# Patient Record
Sex: Male | Born: 1985 | Race: White | Hispanic: No | Marital: Single | Smoking: Current every day smoker
Health system: Southern US, Community
[De-identification: ages and names within clinical notes are randomized; demographics above are authoritative.]

## PROBLEM LIST (undated history)

## (undated) DIAGNOSIS — F1120 Opioid dependence, uncomplicated: Secondary | ICD-10-CM

## (undated) DIAGNOSIS — Z9889 Other specified postprocedural states: Secondary | ICD-10-CM

## (undated) DIAGNOSIS — J45909 Unspecified asthma, uncomplicated: Secondary | ICD-10-CM

## (undated) HISTORY — PX: CRANIOTOMY: SHX93

## (undated) HISTORY — PX: OTHER SURGICAL HISTORY: SHX169

---

## 2006-03-31 ENCOUNTER — Inpatient Hospital Stay (HOSPITAL_COMMUNITY): Admission: AC | Admit: 2006-03-31 | Discharge: 2006-04-07 | Payer: Self-pay

## 2006-04-06 ENCOUNTER — Ambulatory Visit: Payer: Self-pay | Admitting: Dentistry

## 2006-04-07 ENCOUNTER — Ambulatory Visit: Payer: Self-pay | Admitting: Physical Medicine & Rehabilitation

## 2006-04-07 ENCOUNTER — Inpatient Hospital Stay (HOSPITAL_COMMUNITY)
Admission: RE | Admit: 2006-04-07 | Discharge: 2006-04-15 | Payer: Self-pay | Admitting: Physical Medicine & Rehabilitation

## 2006-04-22 ENCOUNTER — Encounter
Admission: RE | Admit: 2006-04-22 | Discharge: 2006-07-21 | Payer: Self-pay | Admitting: Physical Medicine & Rehabilitation

## 2006-05-03 ENCOUNTER — Inpatient Hospital Stay (HOSPITAL_COMMUNITY): Admission: RE | Admit: 2006-05-03 | Discharge: 2006-05-05 | Payer: Self-pay | Admitting: Neurosurgery

## 2006-05-12 ENCOUNTER — Encounter
Admission: RE | Admit: 2006-05-12 | Discharge: 2006-08-10 | Payer: Self-pay | Admitting: Physical Medicine & Rehabilitation
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
## 2008-08-24 ENCOUNTER — Emergency Department (HOSPITAL_BASED_OUTPATIENT_CLINIC_OR_DEPARTMENT_OTHER): Admission: EM | Admit: 2008-08-24 | Discharge: 2008-08-24 | Payer: Self-pay | Admitting: Emergency Medicine

## 2008-08-24 ENCOUNTER — Ambulatory Visit: Payer: Self-pay | Admitting: Diagnostic Radiology

## 2008-09-07 ENCOUNTER — Emergency Department (HOSPITAL_BASED_OUTPATIENT_CLINIC_OR_DEPARTMENT_OTHER): Admission: EM | Admit: 2008-09-07 | Discharge: 2008-09-07 | Payer: Self-pay | Admitting: Emergency Medicine

## 2008-09-08 ENCOUNTER — Ambulatory Visit: Payer: Self-pay | Admitting: Radiology

## 2008-09-08 ENCOUNTER — Emergency Department (HOSPITAL_BASED_OUTPATIENT_CLINIC_OR_DEPARTMENT_OTHER): Admission: EM | Admit: 2008-09-08 | Discharge: 2008-09-08 | Payer: Self-pay | Admitting: Emergency Medicine

## 2008-12-08 ENCOUNTER — Emergency Department (HOSPITAL_BASED_OUTPATIENT_CLINIC_OR_DEPARTMENT_OTHER): Admission: EM | Admit: 2008-12-08 | Discharge: 2008-12-08 | Payer: Self-pay | Admitting: Emergency Medicine

## 2009-01-23 ENCOUNTER — Emergency Department (HOSPITAL_BASED_OUTPATIENT_CLINIC_OR_DEPARTMENT_OTHER): Admission: EM | Admit: 2009-01-23 | Discharge: 2009-01-23 | Payer: Self-pay | Admitting: Emergency Medicine

## 2009-01-23 ENCOUNTER — Ambulatory Visit: Payer: Self-pay | Admitting: Diagnostic Radiology

## 2009-02-23 ENCOUNTER — Ambulatory Visit: Payer: Self-pay | Admitting: Diagnostic Radiology

## 2009-02-23 ENCOUNTER — Emergency Department (HOSPITAL_BASED_OUTPATIENT_CLINIC_OR_DEPARTMENT_OTHER): Admission: EM | Admit: 2009-02-23 | Discharge: 2009-02-23 | Payer: Self-pay | Admitting: Emergency Medicine

## 2009-07-28 ENCOUNTER — Emergency Department (HOSPITAL_BASED_OUTPATIENT_CLINIC_OR_DEPARTMENT_OTHER): Admission: EM | Admit: 2009-07-28 | Discharge: 2009-07-28 | Payer: Self-pay | Admitting: Emergency Medicine
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
## 2014-09-09 ENCOUNTER — Encounter (HOSPITAL_BASED_OUTPATIENT_CLINIC_OR_DEPARTMENT_OTHER): Payer: Self-pay | Admitting: *Deleted

## 2014-09-09 ENCOUNTER — Emergency Department (HOSPITAL_BASED_OUTPATIENT_CLINIC_OR_DEPARTMENT_OTHER): Payer: Medicaid Other

## 2014-09-09 ENCOUNTER — Emergency Department (HOSPITAL_BASED_OUTPATIENT_CLINIC_OR_DEPARTMENT_OTHER)
Admission: EM | Admit: 2014-09-09 | Discharge: 2014-09-09 | Disposition: A | Discharge status: Home / Self Care | Payer: Medicaid Other | Attending: Emergency Medicine | Admitting: Emergency Medicine

## 2014-09-09 DIAGNOSIS — Y998 Other external cause status: Secondary | ICD-10-CM | POA: Insufficient documentation

## 2014-09-09 DIAGNOSIS — Z041 Encounter for examination and observation following transport accident: Secondary | ICD-10-CM | POA: Diagnosis present

## 2014-09-09 DIAGNOSIS — S3992XA Unspecified injury of lower back, initial encounter: Secondary | ICD-10-CM | POA: Diagnosis not present

## 2014-09-09 DIAGNOSIS — Z9889 Other specified postprocedural states: Secondary | ICD-10-CM

## 2014-09-09 DIAGNOSIS — Y92410 Unspecified street and highway as the place of occurrence of the external cause: Secondary | ICD-10-CM | POA: Insufficient documentation

## 2014-09-09 DIAGNOSIS — Y9389 Activity, other specified: Secondary | ICD-10-CM | POA: Insufficient documentation

## 2014-09-09 DIAGNOSIS — S0990XA Unspecified injury of head, initial encounter: Secondary | ICD-10-CM

## 2014-09-09 DIAGNOSIS — Z87820 Personal history of traumatic brain injury: Secondary | ICD-10-CM | POA: Diagnosis not present

## 2014-09-09 HISTORY — DX: Unspecified asthma, uncomplicated: J45.909
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
## 2014-09-09 NOTE — ED Notes (Signed)
Returned from CT.

## 2014-09-09 NOTE — ED Notes (Addendum)
Pt is on the call light every few seconds wanting his charger for his cellphone, states the "radiation exposure"  from the CT has made him nauseated. Wants to sit in a wheelchair. Asking about his girlfriend is also a patient. States he needs to leave soon and go to the " "pain clinic" Pt made aware that it takes awhile for the CT test to be read.

## 2014-09-09 NOTE — ED Notes (Signed)
MD with pt  
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
## 2014-09-09 NOTE — Discharge Instructions (Signed)

## 2014-09-09 NOTE — ED Notes (Signed)
Transported to CT 

## 2020-10-26 ENCOUNTER — Emergency Department (HOSPITAL_BASED_OUTPATIENT_CLINIC_OR_DEPARTMENT_OTHER): Payer: Medicaid Other

## 2020-10-26 ENCOUNTER — Encounter (HOSPITAL_BASED_OUTPATIENT_CLINIC_OR_DEPARTMENT_OTHER): Payer: Self-pay | Admitting: Emergency Medicine

## 2020-10-26 ENCOUNTER — Emergency Department (HOSPITAL_BASED_OUTPATIENT_CLINIC_OR_DEPARTMENT_OTHER)
Admission: EM | Admit: 2020-10-26 | Discharge: 2020-10-27 | Disposition: A | Discharge status: Home / Self Care | Payer: Medicaid Other | Attending: Emergency Medicine | Admitting: Emergency Medicine

## 2020-10-26 ENCOUNTER — Other Ambulatory Visit: Payer: Self-pay

## 2020-10-26 DIAGNOSIS — W109XXA Fall (on) (from) unspecified stairs and steps, initial encounter: Secondary | ICD-10-CM | POA: Insufficient documentation

## 2020-10-26 DIAGNOSIS — S91111A Laceration without foreign body of right great toe without damage to nail, initial encounter: Secondary | ICD-10-CM

## 2020-10-26 DIAGNOSIS — S92414A Nondisplaced fracture of proximal phalanx of right great toe, initial encounter for closed fracture: Secondary | ICD-10-CM | POA: Insufficient documentation

## 2020-10-26 DIAGNOSIS — J45909 Unspecified asthma, uncomplicated: Secondary | ICD-10-CM | POA: Diagnosis not present

## 2020-10-26 DIAGNOSIS — Y9301 Activity, walking, marching and hiking: Secondary | ICD-10-CM | POA: Insufficient documentation

## 2020-10-26 DIAGNOSIS — Y9289 Other specified places as the place of occurrence of the external cause: Secondary | ICD-10-CM | POA: Diagnosis not present

## 2020-10-26 DIAGNOSIS — S99921A Unspecified injury of right foot, initial encounter: Secondary | ICD-10-CM | POA: Diagnosis present

## 2020-10-26 DIAGNOSIS — F17200 Nicotine dependence, unspecified, uncomplicated: Secondary | ICD-10-CM | POA: Diagnosis not present

## 2020-10-26 NOTE — ED Provider Notes (Signed)
[facility] EMERGENCY DEPARTMENT Provider Note   CSN: [id number] Arrival date & time: 10/26/20  2319     History Chief Complaint  Patient presents with   Extremity Laceration    Timothy Smith is a 35 y.o. male.  Patient is a 35 year old male with history of asthma.  He presents with a right great toe injury.  He states he was walking downstairs when he fell and hyperextended his toe and caused a laceration in the area of the MCP joint.  He reports pain with range of motion.  Pain relieved somewhat with rest.  The history is provided by the patient.      Past Medical History:  Diagnosis Date   Asthma     There are no problems to display for this patient.   Past Surgical History:  Procedure Laterality Date   CRANIOTOMY     OTHER SURGICAL HISTORY     collapsed lung        No family history on file.  Social History   Tobacco Use   Smoking status: Every Day    Pack years: 0.00  Substance Use Topics   Alcohol use: No   Drug use: No    Home Medications Prior to Admission medications   Not on File    Allergies    Patient has no known allergies.  Review of Systems   Review of Systems  All other systems reviewed and are negative.  Physical Exam Updated Vital Signs BP (!) 162/107 (BP Location: Right Arm)   Pulse 92   Temp 98.4 F (36.9 C) (Oral)   Resp 18   Ht 5\' 11"  (1.803 m)   Wt 68 kg   SpO2 99%   BMI 20.92 kg/m   Physical Exam Vitals and nursing note reviewed.  Constitutional:      General: He is not in acute distress.    Appearance: Normal appearance. He is not ill-appearing.  HENT:     Head: Normocephalic and atraumatic.  Pulmonary:     Effort: Pulmonary effort is normal.  Musculoskeletal:     Comments: The right great toe has a jagged laceration through callused tissue to the flexor surface.  He is able to flex and extend the toe, but with some discomfort.  Sensation is intact and capillary refill is brisk.  Skin:     General: Skin is warm and dry.  Neurological:     Mental Status: He is alert.    ED Results / Procedures / Treatments   Labs (all labs ordered are listed, but only abnormal results are displayed) Labs Reviewed - No data to display  EKG None  Radiology No results found.  Procedures Procedures   Medications Ordered in ED Medications - No data to display  ED Course  I have reviewed the triage vital signs and the nursing notes.  Pertinent labs & imaging results that were available during my care of the patient were reviewed by me and considered in my medical decision making (see chart for details).    MDM Rules/Calculators/A&P  X-rays show an irregularity in the distal aspect of the first proximal phalanx which may represent an undisplaced fracture, but is otherwise normal.  Patient has a jagged, superficial laceration to the plantar surface of the toe with no tendon involvement.  I see no reason to stitch this and feel as though it will heal fine with immobilization and local wound care.  Patient will be prescribed antibiotics due to this being on the  bottom of his foot.  He is to follow-up as needed.  Final Clinical Impression(s) / ED Diagnoses Final diagnoses:  None    Rx / DC Orders ED Discharge Orders     None        Geoffery Lyons, MD 10/27/20 [phone]

## 2020-10-26 NOTE — ED Triage Notes (Signed)
Reports he fell down some steps.  Now has a lac to the right great toe.

## 2020-10-27 MED ORDER — CEPHALEXIN 500 MG PO CAPS: 500.0000 mg | ORAL_CAPSULE | Freq: Four times a day (QID) | ORAL | 0 refills | Status: DC

## 2020-10-27 MED ORDER — CEPHALEXIN 250 MG PO CAPS
500.0000 mg | ORAL_CAPSULE | Freq: Once | ORAL | Status: AC
  Administered 2020-10-27: 500 mg via ORAL
  Filled 2020-10-27: qty 2

## 2020-10-27 NOTE — Discharge Instructions (Addendum)
Local wound care with bacitracin and dressing changes once daily.    Keep the wound clean dry and covered and keep your toe immobilized for the next 10 to 14 days.  Begin taking Keflex as prescribed.  Return to the ER if you develop redness extending up the foot or leg, pus draining from the wound, worsening pain, or other new and concerning symptoms.

## 2020-10-29 ENCOUNTER — Encounter (HOSPITAL_BASED_OUTPATIENT_CLINIC_OR_DEPARTMENT_OTHER): Payer: Self-pay

## 2020-10-29 ENCOUNTER — Other Ambulatory Visit: Payer: Self-pay

## 2020-10-29 ENCOUNTER — Emergency Department (HOSPITAL_BASED_OUTPATIENT_CLINIC_OR_DEPARTMENT_OTHER)
Admission: EM | Admit: 2020-10-29 | Discharge: 2020-10-29 | Disposition: A | Discharge status: Home / Self Care | Payer: Medicaid Other | Attending: Emergency Medicine | Admitting: Emergency Medicine

## 2020-10-29 DIAGNOSIS — S99921D Unspecified injury of right foot, subsequent encounter: Secondary | ICD-10-CM | POA: Diagnosis present

## 2020-10-29 DIAGNOSIS — J45909 Unspecified asthma, uncomplicated: Secondary | ICD-10-CM | POA: Insufficient documentation

## 2020-10-29 DIAGNOSIS — F17210 Nicotine dependence, cigarettes, uncomplicated: Secondary | ICD-10-CM | POA: Diagnosis not present

## 2020-10-29 DIAGNOSIS — Y9301 Activity, walking, marching and hiking: Secondary | ICD-10-CM | POA: Insufficient documentation

## 2020-10-29 DIAGNOSIS — W108XXD Fall (on) (from) other stairs and steps, subsequent encounter: Secondary | ICD-10-CM | POA: Insufficient documentation

## 2020-10-29 DIAGNOSIS — S91111D Laceration without foreign body of right great toe without damage to nail, subsequent encounter: Secondary | ICD-10-CM | POA: Diagnosis not present

## 2020-10-29 NOTE — Discharge Instructions (Addendum)
Please continue wearing your post op shoe and using the crutches to allow for immobilization of your toe for the next 10 days.  Keep wound clean and dry. You can take Ibuprofen as needed for pain.  Continue taking the antibiotics as prescribed.   Follow up with your PCP regarding ED visit today. You can also follow up with Podiatry for further evaluation.   Return to the ED for any signs of infection including redness/swelling around the wound or redness extending into your foot/leg, drainage of pus, fevers > 100.4, chills, or any other new/concerning symptoms.

## 2020-10-29 NOTE — ED Provider Notes (Signed)
[facility] EMERGENCY DEPARTMENT Provider Note   CSN: [id number] Arrival date & time: 10/29/20  0912     History Chief Complaint  Patient presents with   Laceration    Timothy Smith is a 35 y.o. male who presents to the ED today after sustaining a laceration to his R great toe 3 days ago. Pt was walking down the stairs when he hyperextended his toe causing a laceration to the plantar aspect along the MTP joint.  He was seen in the ED at that time with an x-ray which showed a possible undisplaced fracture of the proximal phalanx.  It was determined that the laceration was too jagged and irregular for suturing.  He was provided a cam walker, crutches, advised to immobilize the toe for the next 10 to 14 days and allow for secondary healing.  He was prescribed antibiotics prevent infection and was advised to come back to the ED for any new/worsening symptoms.  Patient states he is here today for further evaluation.  He has not taken the dressing off for the last 3 days or looked at the toe however he states he was concerned that there may be "pieces of gravel" in the wound itself.  It was irrigated in the ED 3 days ago however.  Patient has been compliant with his antibiotic.  Denies any pain to the area.  He denies any redness, swelling, fevers, chills.  He states he does not have anyone to follow-up with as a he has not seen his PCP in approximately 1 to 2 years prompting return ED visit today.   The history is provided by the patient and medical records.      Past Medical History:  Diagnosis Date   Asthma     There are no problems to display for this patient.   Past Surgical History:  Procedure Laterality Date   CRANIOTOMY     OTHER SURGICAL HISTORY     collapsed lung        History reviewed. No pertinent family history.  Social History   Tobacco Use   Smoking status: Every Day    Packs/day: 0.50    Years: 15.00    Pack years: 7.50    Types: Cigarettes    Smokeless tobacco: Never  Substance Use Topics   Alcohol use: No   Drug use: No    Home Medications Prior to Admission medications   Medication Sig Start Date End Date Taking? Authorizing Provider  cephALEXin (KEFLEX) 500 MG capsule Take 1 capsule (500 mg total) by mouth 4 (four) times daily. 10/27/20  Yes Geoffery Lyons, MD    Allergies    Patient has no known allergies.  Review of Systems   Review of Systems  Constitutional:  Negative for chills and fever.  Musculoskeletal:  Negative for arthralgias.  Skin:  Positive for wound.  All other systems reviewed and are negative.  Physical Exam Updated Vital Signs BP (!) 158/102 (BP Location: Right Arm)   Pulse 98   Temp 98 F (36.7 C) (Oral)   Resp 20   Ht 5\' 11"  (1.803 m)   Wt 68 kg   SpO2 100%   BMI 20.92 kg/m   Physical Exam Vitals and nursing note reviewed.  Constitutional:      Appearance: He is not ill-appearing.  HENT:     Head: Normocephalic and atraumatic.  Eyes:     Conjunctiva/sclera: Conjunctivae normal.  Cardiovascular:     Rate and Rhythm: Normal rate and regular  rhythm.  Pulmonary:     Effort: Pulmonary effort is normal.     Breath sounds: Normal breath sounds.  Musculoskeletal:     Comments: Dressing in place to R great toe removed. Large irregular/jagged laceration to the plantar aspect of the R great toe along the MTP joint; healing by secondary intention. No erythema, edema, or drainage of pus. ROM limited to toe s/2 mild pain. Cap refill < 2 seconds. 2+ DP pulse.   Skin:    General: Skin is warm and dry.     Coloration: Skin is not jaundiced.  Neurological:     Mental Status: He is alert.    ED Results / Procedures / Treatments   Labs (all labs ordered are listed, but only abnormal results are displayed) Labs Reviewed - No data to display  EKG None  Radiology No results found.  Procedures Procedures   Medications Ordered in ED Medications - No data to display  ED Course  I have  reviewed the triage vital signs and the nursing notes.  Pertinent labs & imaging results that were available during my care of the patient were reviewed by me and considered in my medical decision making (see chart for details).    MDM Rules/Calculators/A&P                          35 year old male who presents to the ED today for reevaluation of laceration to his right great toe plantar aspect that occurred 3 days ago.  Evaluated in the ED at that time, laceration determined to be too jagged/irregular for suturing.  Has been using crutches and postop shoe and been compliant with antibiotics.  He states he is mostly concerned that there could be gravel in the wound itself however he does report that it was irrigated while in the ED 3 days ago.  On my exam he has a Product manager in nature to the plantar aspect of his right great toe, along the MTP joint, healing by secondary intention without any signs of infection at this time.  Recently evaluated x-ray from 3 days ago, no signs of foreign body including gravel in the x-ray itself.  Given wound was irrigated 3 days ago I do not feel it needs to be reirrigated at this time.  We will plan for redressing at this time.  Patient advised to continue antibiotics and immobilization for the next 10 days.  He is advised to follow-up with his PCP.  He states he has not seen his PCP in 1 to 2 years however he should still be a patient.  Advised to return to the ED for any signs of infection.  Patient is in agreement with plan and stable for discharge.   This note was prepared using Dragon voice recognition software and may include unintentional dictation errors due to the inherent limitations of voice recognition software.   Final Clinical Impression(s) / ED Diagnoses Final diagnoses:  Laceration of right great toe without foreign body present or damage to nail, subsequent encounter    Rx / DC Orders ED Discharge Orders     None        Discharge  Instructions      Please continue wearing your post op shoe and using the crutches to allow for immobilization of your toe for the next 10 days.  Keep wound clean and dry. You can take Ibuprofen as needed for pain.  Continue taking the antibiotics as prescribed.  Follow up with your PCP regarding ED visit today. You can also follow up with Podiatry for further evaluation.   Return to the ED for any signs of infection including redness/swelling around the wound or redness extending into your foot/leg, drainage of pus, fevers > 100.4, chills, or any other new/concerning symptoms.        Tanda Rockers, PA-C 10/29/20 1027    Pollyann Savoy, MD 10/29/20 1425

## 2020-10-29 NOTE — ED Triage Notes (Signed)
"  Fell down some steps 3 days ago and cut my right great toe. Was treated here and want to get it rechecked today" per pt

## 2021-02-28 ENCOUNTER — Emergency Department (HOSPITAL_BASED_OUTPATIENT_CLINIC_OR_DEPARTMENT_OTHER)
Admission: EM | Admit: 2021-02-28 | Discharge: 2021-02-28 | Disposition: A | Discharge status: Home / Self Care | Payer: Medicaid Other | Attending: Emergency Medicine | Admitting: Emergency Medicine

## 2021-02-28 ENCOUNTER — Other Ambulatory Visit: Payer: Self-pay

## 2021-02-28 ENCOUNTER — Encounter (HOSPITAL_BASED_OUTPATIENT_CLINIC_OR_DEPARTMENT_OTHER): Payer: Self-pay | Admitting: *Deleted

## 2021-02-28 DIAGNOSIS — J45909 Unspecified asthma, uncomplicated: Secondary | ICD-10-CM | POA: Diagnosis not present

## 2021-02-28 DIAGNOSIS — R220 Localized swelling, mass and lump, head: Secondary | ICD-10-CM | POA: Diagnosis present

## 2021-02-28 DIAGNOSIS — M272 Inflammatory conditions of jaws: Secondary | ICD-10-CM | POA: Insufficient documentation

## 2021-02-28 DIAGNOSIS — F17210 Nicotine dependence, cigarettes, uncomplicated: Secondary | ICD-10-CM | POA: Insufficient documentation

## 2021-02-28 MED ORDER — AMOXICILLIN-POT CLAVULANATE 875-125 MG PO TABS: 1.0000 | ORAL_TABLET | Freq: Two times a day (BID) | ORAL | 0 refills | Status: AC

## 2021-02-28 NOTE — ED Notes (Signed)
Patient requesting no IV sticks for labs, EDP aware.

## 2021-02-28 NOTE — ED Triage Notes (Signed)
Woke with swelling to the right side of his face. He has had a toothache recently.

## 2021-02-28 NOTE — Discharge Instructions (Addendum)
Dear Mr. Timothy Smith,  Today we evaluated you in the emergency department for the swelling of face and pain.  This is likely an odontogenic infection.  We will give you a course of Augmentin to take twice daily for 1 week.  It will be very important that you follow-up with a dentist in the area.  We recommend calling the office of Dr. Maryan Puls.  If your symptoms do not improve, if your pain worsens, or if you begin to develop fever chills or pain in your chest please return to the emergency department immediately.
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
## 2021-08-22 ENCOUNTER — Emergency Department (HOSPITAL_BASED_OUTPATIENT_CLINIC_OR_DEPARTMENT_OTHER)
Admission: EM | Admit: 2021-08-22 | Discharge: 2021-08-22 | Disposition: A | Discharge status: Home / Self Care | Payer: Medicaid Other | Attending: Emergency Medicine | Admitting: Emergency Medicine

## 2021-08-22 ENCOUNTER — Other Ambulatory Visit: Payer: Self-pay

## 2021-08-22 ENCOUNTER — Encounter (HOSPITAL_BASED_OUTPATIENT_CLINIC_OR_DEPARTMENT_OTHER): Payer: Self-pay | Admitting: Emergency Medicine

## 2021-08-22 DIAGNOSIS — L539 Erythematous condition, unspecified: Secondary | ICD-10-CM | POA: Insufficient documentation

## 2021-08-22 DIAGNOSIS — R21 Rash and other nonspecific skin eruption: Secondary | ICD-10-CM | POA: Insufficient documentation

## 2021-08-22 MED ORDER — HYDROCORTISONE 2.5 % EX LOTN: TOPICAL_LOTION | Freq: Two times a day (BID) | CUTANEOUS | 0 refills | Status: DC
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
## 2021-08-22 NOTE — ED Provider Notes (Signed)
?[facility] EMERGENCY DEPARTMENT ?Provider Note ? ? ?CSN: [id number] ?Arrival date & time: 08/22/21  1312 ? ?  ? ?History ? ?Chief Complaint  ?Patient presents with  ? Rash  ? ? ?Timothy Smith is a 36 y.o. male. ? ?HPI ?Patient is a 36 year old male who presents to the emergency department due to a rash.  States that it started about 1 day ago.  States that it is burning and itching in sensation and diffuse across his body.  Reports that he purchased a new pair of shoes and a new short which she is currently wearing just prior to the onset of his symptoms.  States that he did not wash the shirt prior to wearing it.  No shortness of breath, angioedema, nausea, vomiting. ?  ? ?Home Medications ?Prior to Admission medications   ?Medication Sig Start Date End Date Taking? Authorizing Provider  ?hydrocortisone 2.5 % lotion Apply topically 2 (two) times daily. 08/22/21  Yes Rayna Sexton, PA-C  ?cephALEXin (KEFLEX) 500 MG capsule Take 1 capsule (500 mg total) by mouth 4 (four) times daily. 10/27/20   Veryl Speak, MD  ?   ? ?Allergies    ?Patient has no known allergies.   ? ?Review of Systems   ?Review of Systems  ?Respiratory:  Negative for shortness of breath.   ?Cardiovascular:  Negative for chest pain.  ?Gastrointestinal:  Negative for nausea and vomiting.  ?Skin:  Positive for color change and rash.  ? ?Physical Exam ?Updated Vital Signs ?BP (!) 178/98 (BP Location: Left Arm)   Pulse 98   Temp 98.4 ?F (36.9 ?C)   Resp 20   Ht 5\' 9"  (1.753 m)   Wt 68 kg   SpO2 100%   BMI 22.15 kg/m?  ?Physical Exam ?Vitals and nursing note reviewed.  ?Constitutional:   ?   General: He is not in acute distress. ?   Appearance: He is well-developed.  ?HENT:  ?   Head: Normocephalic and atraumatic.  ?   Right Ear: External ear normal.  ?   Left Ear: External ear normal.  ?   Mouth/Throat:  ?   Mouth: Mucous membranes are moist.  ?   Pharynx: Oropharynx is clear. No oropharyngeal exudate or posterior oropharyngeal  erythema.  ?   Comments: No angioedema.  Uvula midline.  Readily handling secretions.  No stridor. ?Eyes:  ?   General: No scleral icterus.    ?   Right eye: No discharge.     ?   Left eye: No discharge.  ?   Conjunctiva/sclera: Conjunctivae normal.  ?Neck:  ?   Trachea: No tracheal deviation.  ?Cardiovascular:  ?   Rate and Rhythm: Normal rate.  ?Pulmonary:  ?   Effort: Pulmonary effort is normal. No respiratory distress.  ?   Breath sounds: No stridor.  ?Abdominal:  ?   General: Abdomen is flat. There is no distension.  ?Musculoskeletal:     ?   General: No swelling or deformity.  ?   Cervical back: Neck supple.  ?Skin: ?   General: Skin is warm and dry.  ?   Findings: Erythema present. No rash.  ?   Comments: Fine papular erythematous rash noted along the anterior chest wall.  No drainage noted from the site.  ?Neurological:  ?   General: No focal deficit present.  ?   Mental Status: He is alert and oriented to person, place, and time.  ?   Cranial Nerves: Cranial nerve deficit: no  gross deficits.  ? ?ED Results / Procedures / Treatments   ?Labs ?(all labs ordered are listed, but only abnormal results are displayed) ?Labs Reviewed - No data to display ? ?EKG ?None ? ?Radiology ?No results found. ? ?Procedures ?Procedures  ? ?Medications Ordered in ED ?Medications - No data to display ? ?ED Course/ Medical Decision Making/ A&P ?  ?                        ?Medical Decision Making ?Risk ?Prescription drug management. ? ?Patient is a 36 year old male who presents to the emergency department due to a rash that started yesterday. ? ?On my initial exam patient has a fine papular erythematous rash along the anterior chest wall.  States that he is wearing a new T-shirt that he did not wash prior wearing.  Symptoms started just after wearing the T-shirt.  Appears consistent with contact dermatitis.  Physical exam otherwise reassuring.  No angioedema.  Readily handling secretions.  Speaking clearly and coherently and in no  respiratory distress at this time.  No systemic symptoms concerning for anaphylaxis. ? ?Patient appears stable for discharge at this time and he is agreeable.  Will discharge on a course of hydrocortisone cream.  Additionally recommended Benadryl at night for itchiness as well as difficulty sleeping.  Discussed return precautions.  His questions were answered and he was amicable at the time of discharge. ?Final Clinical Impression(s) / ED Diagnoses ?Final diagnoses:  ?Rash  ? ?Rx / DC Orders ?ED Discharge Orders   ? ?      Ordered  ?  hydrocortisone 2.5 % lotion  2 times daily       ? 08/22/21 1520  ? ?  ?  ? ?  ? ? ?  ?Rayna Sexton, PA-C ?08/22/21 1525 ? ?  ?Hayden Rasmussen, MD ?08/23/21 [phone] ? ?

## 2021-08-22 NOTE — Discharge Instructions (Signed)
Like we discussed, I am prescribing you a steroid cream called hydrocortisone.  This can also be purchased over-the-counter.  Please apply this to the affected regions 1-2 times per day.  This should help with the itching and rash. ? ?I would also recommend trying Benadryl at night.  This will help with itching as well as difficulty sleeping.  Please follow the instructions on the box. ? ?Please continue to monitor your symptoms closely.  If you develop any new or worsening symptoms please come back to the emergency department. ?

## 2021-08-22 NOTE — ED Notes (Signed)
Pt reports "feeling like bugs are crawling on me". Points to discoloration on pants and states that they are on his clothes. Also c/o burning in his eyes and nostrils. ?

## 2022-03-23 ENCOUNTER — Encounter (HOSPITAL_BASED_OUTPATIENT_CLINIC_OR_DEPARTMENT_OTHER): Payer: Self-pay

## 2022-03-23 ENCOUNTER — Other Ambulatory Visit: Payer: Self-pay

## 2022-03-23 ENCOUNTER — Emergency Department (HOSPITAL_BASED_OUTPATIENT_CLINIC_OR_DEPARTMENT_OTHER)
Admission: EM | Admit: 2022-03-23 | Discharge: 2022-03-23 | Disposition: A | Discharge status: Home / Self Care | Payer: Medicare Other | Attending: Emergency Medicine | Admitting: Emergency Medicine

## 2022-03-23 ENCOUNTER — Emergency Department (HOSPITAL_BASED_OUTPATIENT_CLINIC_OR_DEPARTMENT_OTHER): Payer: Medicare Other

## 2022-03-23 DIAGNOSIS — R109 Unspecified abdominal pain: Secondary | ICD-10-CM | POA: Diagnosis present

## 2022-03-23 DIAGNOSIS — R7309 Other abnormal glucose: Secondary | ICD-10-CM | POA: Insufficient documentation

## 2022-03-23 DIAGNOSIS — K5900 Constipation, unspecified: Secondary | ICD-10-CM | POA: Diagnosis not present

## 2022-03-23 LAB — CBC WITH DIFFERENTIAL/PLATELET
Abs Immature Granulocytes: 0.02 10*3/uL (ref 0.00–0.07)
Basophils Absolute: 0 10*3/uL (ref 0.0–0.1)
Basophils Relative: 1 %
Eosinophils Absolute: 0.2 10*3/uL (ref 0.0–0.5)
Eosinophils Relative: 3 %
HCT: 40.2 % (ref 39.0–52.0)
Hemoglobin: 13.9 g/dL (ref 13.0–17.0)
Immature Granulocytes: 0 %
Lymphocytes Relative: 49 %
Lymphs Abs: 3.7 10*3/uL (ref 0.7–4.0)
MCH: 30.5 pg (ref 26.0–34.0)
MCHC: 34.6 g/dL (ref 30.0–36.0)
MCV: 88.2 fL (ref 80.0–100.0)
Monocytes Absolute: 0.7 10*3/uL (ref 0.1–1.0)
Monocytes Relative: 9 %
Neutro Abs: 2.9 10*3/uL (ref 1.7–7.7)
Neutrophils Relative %: 38 %
Platelets: 267 10*3/uL (ref 150–400)
RBC: 4.56 MIL/uL (ref 4.22–5.81)
RDW: 12.1 % (ref 11.5–15.5)
WBC: 7.5 10*3/uL (ref 4.0–10.5)
nRBC: 0 % (ref 0.0–0.2)

## 2022-03-23 LAB — COMPREHENSIVE METABOLIC PANEL
ALT: 43 U/L (ref 0–44)
AST: 29 U/L (ref 15–41)
Albumin: 3.9 g/dL (ref 3.5–5.0)
Alkaline Phosphatase: 82 U/L (ref 38–126)
Anion gap: 4 — ABNORMAL LOW (ref 5–15)
BUN: 22 mg/dL — ABNORMAL HIGH (ref 6–20)
CO2: 28 mmol/L (ref 22–32)
Calcium: 9.1 mg/dL (ref 8.9–10.3)
Chloride: 106 mmol/L (ref 98–111)
Creatinine, Ser: 1 mg/dL (ref 0.61–1.24)
GFR, Estimated: >60 mL/min (ref >60)
Glucose, Bld: 170 mg/dL — ABNORMAL HIGH (ref 70–99)
Potassium: 4 mmol/L (ref 3.5–5.1)
Sodium: 138 mmol/L (ref 135–145)
Total Bilirubin: 0.6 mg/dL (ref 0.3–1.2)
Total Protein: 7.4 g/dL (ref 6.5–8.1)

## 2022-03-23 MED ORDER — POLYETHYLENE GLYCOL 3350 17 GM/SCOOP PO POWD
1.0000 | Freq: Once | ORAL | 0 refills | Status: AC
Start: 2022-03-23 — End: 2022-03-23

## 2022-03-23 MED ORDER — KETOROLAC TROMETHAMINE 30 MG/ML IJ SOLN
15.0000 mg | Freq: Once | INTRAMUSCULAR | Status: AC
  Administered 2022-03-23: 15 mg via INTRAVENOUS
  Filled 2022-03-23: qty 1

## 2022-03-23 NOTE — ED Triage Notes (Signed)
Pt reports abd pain and thinks he has a hernia. Has had it for years; this past month pain has come back and feels like he cannot do anything due to intermittent sharp pains. Pt reports problems with BM this past month (unable to pass BM easily). RLQ pain and has had a previous surgical scar in that area. Pt states he has had CT scans and they have been clear. Pt with hx of kidney stones; still has appendix.

## 2022-03-23 NOTE — Discharge Instructions (Addendum)
Miralax one capful twice daily for one week then one capful daily.

## 2022-03-23 NOTE — ED Provider Notes (Addendum)
[facility] EMERGENCY DEPARTMENT Provider Note   CSN: [id number] Arrival date & time: 03/23/22  1017     History  Chief Complaint  Patient presents with   Abdominal Pain    Timothy Smith is a 36 y.o. male.  The history is provided by the patient.  Abdominal Pain Pain location: RMQ. Pain quality: pressure   Pain radiates to:  Does not radiate Pain severity:  Moderate Onset quality:  Gradual Duration: years. Timing:  Intermittent Progression:  Waxing and waning Chronicity:  Chronic Context: not eating and not trauma   Relieved by:  Nothing Worsened by:  Nothing Ineffective treatments:  None tried Associated symptoms: constipation   Associated symptoms: no dysuria, no fever and no vomiting   Risk factors: no alcohol abuse   Patient presents intermittent abdominal pain for years.  He has also been constipated for several weeks.  He is on methadone.      Home Medications Prior to Admission medications   Medication Sig Start Date End Date Taking? Authorizing Provider  cephALEXin (KEFLEX) 500 MG capsule Take 1 capsule (500 mg total) by mouth 4 (four) times daily. 10/27/20   Geoffery Lyons, MD  hydrocortisone 2.5 % lotion Apply topically 2 (two) times daily. 08/22/21   Placido Sou, PA-C      Allergies    Patient has no known allergies.    Review of Systems   Review of Systems  Constitutional:  Negative for fever.  HENT:  Negative for facial swelling.   Eyes:  Negative for redness.  Respiratory:  Negative for wheezing and stridor.   Gastrointestinal:  Positive for abdominal pain and constipation. Negative for vomiting.  Genitourinary:  Negative for dysuria.  All other systems reviewed and are negative.   Physical Exam Updated Vital Signs BP (!) 174/113 (BP Location: Right Arm)   Pulse (!) 107   Temp 98.1 F (36.7 C) (Oral)   Resp 18   Ht 5\' 9"  (1.753 m)   Wt 63.5 kg   SpO2 100%   BMI 20.67 kg/m  Physical Exam Vitals and nursing note  reviewed. Exam conducted with a chaperone present.  Constitutional:      General: He is not in acute distress.    Appearance: Normal appearance. He is well-developed. He is not diaphoretic.  HENT:     Head: Normocephalic and atraumatic.     Nose: Nose normal.  Eyes:     Conjunctiva/sclera: Conjunctivae normal.     Pupils: Pupils are equal, round, and reactive to light.  Cardiovascular:     Rate and Rhythm: Normal rate and regular rhythm.  Pulmonary:     Effort: Pulmonary effort is normal.     Breath sounds: Normal breath sounds. No wheezing or rales.  Abdominal:     General: Bowel sounds are normal.     Palpations: Abdomen is soft.     Tenderness: There is no abdominal tenderness. There is no guarding or rebound.     Hernia: No hernia is present.  Musculoskeletal:        General: Normal range of motion.     Cervical back: Normal range of motion and neck supple.  Skin:    General: Skin is warm and dry.     Capillary Refill: Capillary refill takes less than 2 seconds.  Neurological:     General: No focal deficit present.     Mental Status: He is alert and oriented to person, place, and time.  Psychiatric:  Mood and Affect: Mood normal.        Behavior: Behavior normal.     ED Results / Procedures / Treatments   Labs (all labs ordered are listed, but only abnormal results are displayed) Results for orders placed or performed during the hospital encounter of 03/23/22  CBC with Differential  Result Value Ref Range   WBC 7.5 4.0 - 10.5 K/uL   RBC 4.56 4.22 - 5.81 MIL/uL   Hemoglobin 13.9 13.0 - 17.0 g/dL   HCT 00.9 38.1 - 82.9 %   MCV 88.2 80.0 - 100.0 fL   MCH 30.5 26.0 - 34.0 pg   MCHC 34.6 30.0 - 36.0 g/dL   RDW 93.7 16.9 - 67.8 %   Platelets 267 150 - 400 K/uL   nRBC 0.0 0.0 - 0.2 %   Neutrophils Relative % 38 %   Neutro Abs 2.9 1.7 - 7.7 K/uL   Lymphocytes Relative 49 %   Lymphs Abs 3.7 0.7 - 4.0 K/uL   Monocytes Relative 9 %   Monocytes Absolute 0.7 0.1 -  1.0 K/uL   Eosinophils Relative 3 %   Eosinophils Absolute 0.2 0.0 - 0.5 K/uL   Basophils Relative 1 %   Basophils Absolute 0.0 0.0 - 0.1 K/uL   Immature Granulocytes 0 %   Abs Immature Granulocytes 0.02 0.00 - 0.07 K/uL  Comprehensive metabolic panel  Result Value Ref Range   Sodium 138 135 - 145 mmol/L   Potassium 4.0 3.5 - 5.1 mmol/L   Chloride 106 98 - 111 mmol/L   CO2 28 22 - 32 mmol/L   Glucose, Bld 170 (H) 70 - 99 mg/dL   BUN 22 (H) 6 - 20 mg/dL   Creatinine, Ser 9.38 0.61 - 1.24 mg/dL   Calcium 9.1 8.9 - 10.1 mg/dL   Total Protein 7.4 6.5 - 8.1 g/dL   Albumin 3.9 3.5 - 5.0 g/dL   AST 29 15 - 41 U/L   ALT 43 0 - 44 U/L   Alkaline Phosphatase 82 38 - 126 U/L   Total Bilirubin 0.6 0.3 - 1.2 mg/dL   GFR, Estimated >75 >10 mL/min   Anion gap 4 (L) 5 - 15   CT Renal Stone Study  Result Date: 03/23/2022 CLINICAL DATA:  Abdominal/flank pain with stone suspected EXAM: CT ABDOMEN AND PELVIS WITHOUT CONTRAST TECHNIQUE: Multidetector CT imaging of the abdomen and pelvis was performed following the standard protocol without IV contrast. RADIATION DOSE REDUCTION: This exam was performed according to the departmental dose-optimization program which includes automated exposure control, adjustment of the mA and/or kV according to patient size and/or use of iterative reconstruction technique. COMPARISON:  None Available. FINDINGS: Lower chest:  No contributory findings. Hepatobiliary: No focal liver abnormality.No evidence of biliary obstruction or stone. Pancreas: Unremarkable. Spleen: Unremarkable. Adrenals/Urinary Tract: Negative adrenals. No hydronephrosis or ureteral stone. At least 3 right and single left renal calculi measuring up to 5 mm at the left lower pole. unremarkable bladder. Stomach/Bowel: No obstruction. No appendicitis. Stool distended ascending and transverse colon. Vascular/Lymphatic: No acute vascular abnormality. No mass or adenopathy. Reproductive:No pathologic findings.  Other: No ascites or pneumoperitoneum. Right upper quadrant abdominal wall scarring that is stable. Musculoskeletal: No acute abnormalities. IMPRESSION: 1. Bilateral nephrolithiasis.  No hydronephrosis or ureteral stone. 2. Moderate stool retention in the proximal colon. Electronically Signed   By: Tiburcio Pea M.D.   On: 03/23/2022 06:47    Medications Ordered in ED Medications - No data to display  ED Course/  Medical Decision Making/ A&P                           Medical Decision Making   Patient with years of pain but thinks he has a hernia because he read this somewhere.    Amount and/or Complexity of Data Reviewed External Data Reviewed: radiology and notes.    Details: Previous notes and imaging reviewed  Labs: ordered.    Details: All labs reviewed:  normal white count 7.5, hemoglobin 13.9, normal platelet count. Normal sodium 138, normal potassium 4, normal creatinine, glucose elevated 170  Radiology: ordered and independent interpretation performed.    Details: No obstructions by me on CT  Risk Prescription drug management. Risk Details: Well appearing with normal exam.  Patient is on methadone and I suspect this has led to constipation.  Stable for discharge with close follow up.  Strict return.  Start miralax therapy 2 times daily x 1 week then daily thereafter.     Final Clinical Impression(s) / ED Diagnoses Final diagnoses:  None   Return for intractable cough, coughing up blood, fevers > 100.4 unrelieved by medication, shortness of breath, intractable vomiting, chest pain, shortness of breath, weakness, numbness, changes in speech, facial asymmetry, abdominal pain, passing out, Inability to tolerate liquids or food, cough, altered mental status or any concerns. No signs of systemic illness or infection. The patient is nontoxic-appearing on exam and vital signs are within normal limits.  I have reviewed the triage vital signs and the nursing notes. Pertinent labs &  imaging results that were available during my care of the patient were reviewed by me and considered in my medical decision making (see chart for details). After history, exam, and medical workup I feel the patient has been appropriately medically screened and is safe for discharge home. Pertinent diagnoses were discussed with the patient. Patient was given return precautions.       Laqueshia Cihlar, MD 03/23/22 0700

## 2022-04-07 ENCOUNTER — Emergency Department (HOSPITAL_BASED_OUTPATIENT_CLINIC_OR_DEPARTMENT_OTHER)
Admission: EM | Admit: 2022-04-07 | Discharge: 2022-04-07 | Discharge status: Against Medical Advice | Payer: Medicare Other | Attending: Emergency Medicine | Admitting: Emergency Medicine

## 2022-04-07 ENCOUNTER — Other Ambulatory Visit: Payer: Self-pay

## 2022-04-07 ENCOUNTER — Encounter (HOSPITAL_BASED_OUTPATIENT_CLINIC_OR_DEPARTMENT_OTHER): Payer: Self-pay | Admitting: *Deleted

## 2022-04-07 DIAGNOSIS — K625 Hemorrhage of anus and rectum: Secondary | ICD-10-CM | POA: Diagnosis present

## 2022-04-07 DIAGNOSIS — F419 Anxiety disorder, unspecified: Secondary | ICD-10-CM | POA: Diagnosis not present

## 2022-04-07 DIAGNOSIS — K5900 Constipation, unspecified: Secondary | ICD-10-CM | POA: Insufficient documentation

## 2022-04-07 DIAGNOSIS — Z5321 Procedure and treatment not carried out due to patient leaving prior to being seen by health care provider: Secondary | ICD-10-CM | POA: Insufficient documentation

## 2022-04-07 LAB — COMPREHENSIVE METABOLIC PANEL
ALT: 58 U/L — ABNORMAL HIGH (ref 0–44)
AST: 39 U/L (ref 15–41)
Albumin: 4.5 g/dL (ref 3.5–5.0)
Alkaline Phosphatase: 83 U/L (ref 38–126)
Anion gap: 7 (ref 5–15)
BUN: 29 mg/dL — ABNORMAL HIGH (ref 6–20)
CO2: 27 mmol/L (ref 22–32)
Calcium: 9.5 mg/dL (ref 8.9–10.3)
Chloride: 104 mmol/L (ref 98–111)
Creatinine, Ser: 1.02 mg/dL (ref 0.61–1.24)
GFR, Estimated: >60 mL/min (ref >60)
Glucose, Bld: 134 mg/dL — ABNORMAL HIGH (ref 70–99)
Potassium: 3.9 mmol/L (ref 3.5–5.1)
Sodium: 138 mmol/L (ref 135–145)
Total Bilirubin: 1.1 mg/dL (ref 0.3–1.2)
Total Protein: 8.2 g/dL — ABNORMAL HIGH (ref 6.5–8.1)

## 2022-04-07 LAB — CBC
HCT: 41.5 % (ref 39.0–52.0)
Hemoglobin: 14.2 g/dL (ref 13.0–17.0)
MCH: 30.1 pg (ref 26.0–34.0)
MCHC: 34.2 g/dL (ref 30.0–36.0)
MCV: 88.1 fL (ref 80.0–100.0)
Platelets: 271 10*3/uL (ref 150–400)
RBC: 4.71 MIL/uL (ref 4.22–5.81)
RDW: 12.1 % (ref 11.5–15.5)
WBC: 9.2 10*3/uL (ref 4.0–10.5)
nRBC: 0 % (ref 0.0–0.2)

## 2022-04-07 LAB — LIPASE, BLOOD: Lipase: 25 U/L (ref 11–51)

## 2022-04-07 NOTE — ED Triage Notes (Signed)
Pt is here due to blood on his stool today.  Pt shows me picture of stool with blood streak on it.  Pt is anxious.  Pt is not aware that he has hemorrhoids.  Pt has been constipated.

## 2022-04-07 NOTE — ED Notes (Signed)
Pt is not in lobby, checked for pt outside as well

## 2022-04-07 NOTE — ED Notes (Signed)
Called pt for room, no answer, not visualized in lobby

## 2022-05-18 ENCOUNTER — Encounter (HOSPITAL_COMMUNITY): Payer: Self-pay

## 2022-05-18 ENCOUNTER — Emergency Department (HOSPITAL_BASED_OUTPATIENT_CLINIC_OR_DEPARTMENT_OTHER): Payer: Medicare Other

## 2022-05-18 ENCOUNTER — Encounter (HOSPITAL_BASED_OUTPATIENT_CLINIC_OR_DEPARTMENT_OTHER): Payer: Self-pay | Admitting: Emergency Medicine

## 2022-05-18 ENCOUNTER — Other Ambulatory Visit: Payer: Self-pay

## 2022-05-18 ENCOUNTER — Inpatient Hospital Stay (HOSPITAL_BASED_OUTPATIENT_CLINIC_OR_DEPARTMENT_OTHER)
Admission: EM | Admit: 2022-05-18 | Discharge: 2022-05-21 | DRG: 516 | Disposition: A | Discharge status: Home / Self Care | Payer: Medicare Other | Attending: Internal Medicine | Admitting: Internal Medicine

## 2022-05-18 DIAGNOSIS — N179 Acute kidney failure, unspecified: Secondary | ICD-10-CM | POA: Diagnosis not present

## 2022-05-18 DIAGNOSIS — M868X4 Other osteomyelitis, hand: Secondary | ICD-10-CM | POA: Diagnosis present

## 2022-05-18 DIAGNOSIS — F17200 Nicotine dependence, unspecified, uncomplicated: Secondary | ICD-10-CM | POA: Diagnosis present

## 2022-05-18 DIAGNOSIS — B9562 Methicillin resistant Staphylococcus aureus infection as the cause of diseases classified elsewhere: Secondary | ICD-10-CM | POA: Diagnosis present

## 2022-05-18 DIAGNOSIS — F1120 Opioid dependence, uncomplicated: Secondary | ICD-10-CM | POA: Diagnosis present

## 2022-05-18 DIAGNOSIS — F1991 Other psychoactive substance use, unspecified, in remission: Secondary | ICD-10-CM | POA: Diagnosis present

## 2022-05-18 DIAGNOSIS — J45909 Unspecified asthma, uncomplicated: Secondary | ICD-10-CM | POA: Diagnosis present

## 2022-05-18 DIAGNOSIS — B182 Chronic viral hepatitis C: Secondary | ICD-10-CM | POA: Diagnosis present

## 2022-05-18 DIAGNOSIS — R768 Other specified abnormal immunological findings in serum: Secondary | ICD-10-CM

## 2022-05-18 DIAGNOSIS — L818 Other specified disorders of pigmentation: Secondary | ICD-10-CM | POA: Diagnosis not present

## 2022-05-18 DIAGNOSIS — F17210 Nicotine dependence, cigarettes, uncomplicated: Secondary | ICD-10-CM | POA: Diagnosis present

## 2022-05-18 DIAGNOSIS — M869 Osteomyelitis, unspecified: Principal | ICD-10-CM | POA: Diagnosis present

## 2022-05-18 DIAGNOSIS — Z79899 Other long term (current) drug therapy: Secondary | ICD-10-CM | POA: Diagnosis not present

## 2022-05-18 DIAGNOSIS — M86241 Subacute osteomyelitis, right hand: Secondary | ICD-10-CM

## 2022-05-18 DIAGNOSIS — F1129 Opioid dependence with unspecified opioid-induced disorder: Secondary | ICD-10-CM | POA: Diagnosis not present

## 2022-05-18 DIAGNOSIS — Z22322 Carrier or suspected carrier of Methicillin resistant Staphylococcus aureus: Secondary | ICD-10-CM | POA: Diagnosis not present

## 2022-05-18 DIAGNOSIS — R7303 Prediabetes: Secondary | ICD-10-CM | POA: Diagnosis not present

## 2022-05-18 DIAGNOSIS — F1911 Other psychoactive substance abuse, in remission: Secondary | ICD-10-CM | POA: Diagnosis not present

## 2022-05-18 DIAGNOSIS — M86141 Other acute osteomyelitis, right hand: Secondary | ICD-10-CM | POA: Diagnosis not present

## 2022-05-18 DIAGNOSIS — M79644 Pain in right finger(s): Secondary | ICD-10-CM | POA: Diagnosis present

## 2022-05-18 HISTORY — DX: Opioid dependence, uncomplicated: F11.20

## 2022-05-18 HISTORY — DX: Other specified postprocedural states: Z98.890

## 2022-05-18 LAB — CBC WITH DIFFERENTIAL/PLATELET
Abs Immature Granulocytes: 0.01 10*3/uL (ref 0.00–0.07)
Basophils Absolute: 0 10*3/uL (ref 0.0–0.1)
Basophils Relative: 1 %
Eosinophils Absolute: 0.2 10*3/uL (ref 0.0–0.5)
Eosinophils Relative: 3 %
HCT: 45.2 % (ref 39.0–52.0)
Hemoglobin: 15.7 g/dL (ref 13.0–17.0)
Immature Granulocytes: 0 %
Lymphocytes Relative: 37 %
Lymphs Abs: 3.1 10*3/uL (ref 0.7–4.0)
MCH: 30 pg (ref 26.0–34.0)
MCHC: 34.7 g/dL (ref 30.0–36.0)
MCV: 86.4 fL (ref 80.0–100.0)
Monocytes Absolute: 0.6 10*3/uL (ref 0.1–1.0)
Monocytes Relative: 8 %
Neutro Abs: 4.3 10*3/uL (ref 1.7–7.7)
Neutrophils Relative %: 51 %
Platelets: 261 10*3/uL (ref 150–400)
RBC: 5.23 MIL/uL (ref 4.22–5.81)
RDW: 11.7 % (ref 11.5–15.5)
WBC: 8.3 10*3/uL (ref 4.0–10.5)
nRBC: 0 % (ref 0.0–0.2)

## 2022-05-18 LAB — BASIC METABOLIC PANEL
Anion gap: 7 (ref 5–15)
BUN: 21 mg/dL — ABNORMAL HIGH (ref 6–20)
CO2: 26 mmol/L (ref 22–32)
Calcium: 9 mg/dL (ref 8.9–10.3)
Chloride: 101 mmol/L (ref 98–111)
Creatinine, Ser: 0.92 mg/dL (ref 0.61–1.24)
GFR, Estimated: >60 mL/min (ref >60)
Glucose, Bld: 125 mg/dL — ABNORMAL HIGH (ref 70–99)
Potassium: 3.9 mmol/L (ref 3.5–5.1)
Sodium: 134 mmol/L — ABNORMAL LOW (ref 135–145)

## 2022-05-18 LAB — HEMOGLOBIN A1C
Hgb A1c MFr Bld: 5.7 % — ABNORMAL HIGH (ref 4.8–5.6)
Mean Plasma Glucose: 116.89 mg/dL

## 2022-05-18 LAB — HIV ANTIBODY (ROUTINE TESTING W REFLEX): HIV Screen 4th Generation wRfx: NONREACTIVE

## 2022-05-18 MED ORDER — POLYETHYLENE GLYCOL 3350 17 G PO PACK: 17.0000 g | PACK | Freq: Every day | ORAL | Status: DC | PRN

## 2022-05-18 MED ORDER — POVIDONE-IODINE 10 % EX SWAB
2.0000 | Freq: Once | CUTANEOUS | Status: AC
  Administered 2022-05-19: 2 via TOPICAL

## 2022-05-18 MED ORDER — METHADONE HCL 10 MG/ML PO CONC
100.0000 mg | Freq: Every day | ORAL | Status: DC
  Administered 2022-05-18 – 2022-05-21 (×4): 100 mg via ORAL
  Filled 2022-05-18 (×3): qty 10

## 2022-05-18 MED ORDER — HYDRALAZINE HCL 20 MG/ML IJ SOLN: 5.0000 mg | INTRAMUSCULAR | Status: DC | PRN

## 2022-05-18 MED ORDER — CHLORHEXIDINE GLUCONATE 4 % EX LIQD
60.0000 mL | Freq: Once | CUTANEOUS | Status: AC
  Administered 2022-05-19: 4 via TOPICAL
  Filled 2022-05-18: qty 60

## 2022-05-18 MED ORDER — MORPHINE SULFATE (PF) 2 MG/ML IV SOLN
2.0000 mg | INTRAVENOUS | Status: DC | PRN
  Administered 2022-05-18 – 2022-05-19 (×10): 2 mg via INTRAVENOUS
  Filled 2022-05-18 (×11): qty 1

## 2022-05-18 MED ORDER — VANCOMYCIN HCL IN DEXTROSE 1-5 GM/200ML-% IV SOLN
1000.0000 mg | Freq: Once | INTRAVENOUS | Status: AC
Start: 2022-05-18 — End: 2022-05-18
  Administered 2022-05-18: 1000 mg via INTRAVENOUS
  Filled 2022-05-18: qty 200

## 2022-05-18 MED ORDER — ACETAMINOPHEN 325 MG PO TABS
650.0000 mg | ORAL_TABLET | Freq: Four times a day (QID) | ORAL | Status: DC | PRN
  Administered 2022-05-18 – 2022-05-21 (×4): 650 mg via ORAL
  Filled 2022-05-18 (×4): qty 2

## 2022-05-18 MED ORDER — METHADONE HCL 10 MG PO TABS: 100.0000 mg | ORAL_TABLET | Freq: Two times a day (BID) | ORAL | Status: DC

## 2022-05-18 MED ORDER — ONDANSETRON HCL 4 MG PO TABS
4.0000 mg | ORAL_TABLET | Freq: Four times a day (QID) | ORAL | Status: DC | PRN
  Filled 2022-05-18: qty 1

## 2022-05-18 MED ORDER — ACETAMINOPHEN 650 MG RE SUPP
650.0000 mg | Freq: Four times a day (QID) | RECTAL | Status: DC | PRN
  Filled 2022-05-18: qty 1

## 2022-05-18 MED ORDER — OXYCODONE HCL 5 MG PO TABS
5.0000 mg | ORAL_TABLET | ORAL | Status: DC | PRN
  Administered 2022-05-19 – 2022-05-21 (×6): 5 mg via ORAL
  Filled 2022-05-18 (×6): qty 1

## 2022-05-18 MED ORDER — NICOTINE 14 MG/24HR TD PT24
14.0000 mg | MEDICATED_PATCH | Freq: Every day | TRANSDERMAL | Status: DC
  Administered 2022-05-18: 14 mg via TRANSDERMAL
  Filled 2022-05-18 (×2): qty 1

## 2022-05-18 MED ORDER — ENOXAPARIN SODIUM 40 MG/0.4ML IJ SOSY
40.0000 mg | PREFILLED_SYRINGE | INTRAMUSCULAR | Status: DC
  Administered 2022-05-18 – 2022-05-20 (×2): 40 mg via SUBCUTANEOUS
  Filled 2022-05-18 (×3): qty 0.4

## 2022-05-18 MED ORDER — ONDANSETRON HCL 4 MG/2ML IJ SOLN: 4.0000 mg | Freq: Four times a day (QID) | INTRAMUSCULAR | Status: DC | PRN

## 2022-05-18 MED ORDER — BISACODYL 5 MG PO TBEC: 5.0000 mg | DELAYED_RELEASE_TABLET | Freq: Every day | ORAL | Status: DC | PRN

## 2022-05-18 MED ORDER — LACTATED RINGERS IV SOLN: INTRAVENOUS | Status: DC
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
## 2022-05-18 NOTE — ED Notes (Signed)
Notified security that the patient will be leaving his car here at [facility] for a few days.

## 2022-05-18 NOTE — ED Provider Notes (Signed)
EMERGENCY DEPARTMENT AT [facility] Provider Note   CSN: [id number] Arrival date & time: 05/18/22  0325     History  Chief Complaint  Patient presents with   Finger Injury    Timothy Smith is a 37 y.o. male.  Patient is a 37 year old male with past medical history of opiate addiction on methadone.  Patient presenting with complaints of right thumb pain and swelling.  This has been worsening over the past 2 weeks.  He does not recall injuring it specifically, believes he might of injured it while working on his car.  Pain has been significantly worsening over the past few days.  He denies fevers or chills.  The history is provided by the patient.       Home Medications Prior to Admission medications   Medication Sig Start Date End Date Taking? Authorizing Provider  cephALEXin (KEFLEX) 500 MG capsule Take 1 capsule (500 mg total) by mouth 4 (four) times daily. 10/27/20   Veryl Speak, MD  hydrocortisone 2.5 % lotion Apply topically 2 (two) times daily. 08/22/21   Rayna Sexton, PA-C      Allergies    Patient has no allergy information on record.    Review of Systems   Review of Systems  All other systems reviewed and are negative.   Physical Exam Updated Vital Signs BP (!) 136/95 (BP Location: Right Arm)   Pulse 98   Temp 98.7 F (37.1 C) (Oral)   Resp 18   Ht 5\' 11"  (1.803 m)   Wt 63.5 kg   SpO2 100%   BMI 19.53 kg/m  Physical Exam Vitals and nursing note reviewed.  Constitutional:      General: He is not in acute distress.    Appearance: Normal appearance. He is not ill-appearing.  HENT:     Head: Normocephalic and atraumatic.  Pulmonary:     Effort: Pulmonary effort is normal.  Musculoskeletal:     Comments: The right thumb is noted to have swelling, redness, warmth, and is extremely tender to the touch.  Skin:    General: Skin is warm and dry.  Neurological:     Mental Status: He is alert.     ED Results / Procedures /  Treatments   Labs (all labs ordered are listed, but only abnormal results are displayed) Labs Reviewed  BASIC METABOLIC PANEL  CBC WITH DIFFERENTIAL/PLATELET    EKG None  Radiology DG Finger Thumb Right  Result Date: 05/18/2022 CLINICAL DATA:  37 year old male with thumb injury 2 weeks ago. Redness and swelling now. EXAM: RIGHT THUMB 2+V COMPARISON:  None Available. FINDINGS: Soft tissue swelling at the right thumb. No soft tissue gas. No radiopaque foreign body identified. But there is aggressive osteolysis involving up to 5 mm of the tuft of the thumb distal phalanx consistent with osteomyelitis. The thumb IP joint is spared. No superimposed fracture or dislocation. IMPRESSION: Positive for OSTEOMYELITIS of the tuft of the thumb distal phalanx. Regional soft tissue swelling. No radiopaque foreign body. Electronically Signed   By: Genevie Ann M.D.   On: 05/18/2022 04:16      Procedures Procedures    Medications Ordered in ED Medications - No data to display  ED Course/ Medical Decision Making/ A&P  Patient presenting with right thumb pain as described in the HPI.  This began in the absence of any specific injury or trauma other than working on his car.  X-rays identify what appears to be osteomyelitis of the distal  phalanx.  Patient to be given IV antibiotics here in the ER and admitted for IV antibiotics and hand consultation.  Final Clinical Impression(s) / ED Diagnoses Final diagnoses:  None    Rx / DC Orders ED Discharge Orders     None         Veryl Speak, MD 05/27/22 [phone]

## 2022-05-18 NOTE — ED Notes (Signed)
Called [facility] for transport talked to [facility] at 2:23

## 2022-05-18 NOTE — Progress Notes (Signed)
Hospitalist Transfer Note:  Transferring facility: [facility] Requesting provider: Dr. Stark Jock (EDP at [facility]) Reason for transfer: admission for further evaluation and management of concern for osteomyelitis of the right thumb.     37 y.o. male, with history of opioid abuse, currently on methadone,  who presented to [facility] ED complaining of several days of right thumb pain, swelling, erythema, after potentially incurring an abrasion of the right thumb while working on his car 2 to 3 weeks ago.  Not associate with any acute sensory deficits and not associated with any report of diminished range of motion.  Erythema/swelling did not extend to the level of the wrist, and there is no report of any diminished wrist flexion/extension associated with the above.  Plain films of the right hand were suggestive of potential osteomyelitis involving the distal phalanx of the thumb.   Vital signs in the ED were notable for the following: Afebrile; normotensive.  Medications administered prior to transfer included the following: Started on IV vancomycin.  Of note, case has not been d/w hand surgeon at this time.    Subsequently, I accepted this patient for transfer for inpatient admission to a MedSurg bed at [facility] or [facility] (first available) for further work-up and management of the above.       Check [email or web address] for on-call coverage.   Nursing staff, Please call [facility] number on Amion as soon as patient's arrival, so appropriate admitting provider can evaluate the pt.     Babs Bertin, DO Hospitalist

## 2022-05-18 NOTE — Progress Notes (Signed)
Patient arrived assessment is complete at this time. BP is elevated due to pain in the right thumb will be going for a wash out tomorrow.

## 2022-05-18 NOTE — ED Notes (Signed)
Pt gave verbal consent for Transfer to [facility]. Pharmacy tech is evaluating his medication history at bedside.

## 2022-05-18 NOTE — ED Triage Notes (Signed)
Pt reports his he "think I injured my right thumb two weeks ago."  Thumb appears swollen and discolored and is very warm to the touch.  Pt has trouble bending thumb and the redness is "going down my thumb."

## 2022-05-19 ENCOUNTER — Inpatient Hospital Stay (HOSPITAL_COMMUNITY): Payer: Medicare Other | Admitting: Certified Registered Nurse Anesthetist

## 2022-05-19 ENCOUNTER — Other Ambulatory Visit: Payer: Self-pay

## 2022-05-19 ENCOUNTER — Encounter (HOSPITAL_COMMUNITY)
Admission: EM | Disposition: A | Discharge status: Home / Self Care | Payer: Self-pay | Source: Home / Self Care | Attending: Internal Medicine

## 2022-05-19 ENCOUNTER — Encounter (HOSPITAL_COMMUNITY): Payer: Self-pay | Admitting: Internal Medicine

## 2022-05-19 DIAGNOSIS — M869 Osteomyelitis, unspecified: Secondary | ICD-10-CM | POA: Diagnosis not present

## 2022-05-19 DIAGNOSIS — F1129 Opioid dependence with unspecified opioid-induced disorder: Secondary | ICD-10-CM | POA: Diagnosis not present

## 2022-05-19 DIAGNOSIS — R7303 Prediabetes: Secondary | ICD-10-CM | POA: Diagnosis not present

## 2022-05-19 DIAGNOSIS — J45909 Unspecified asthma, uncomplicated: Secondary | ICD-10-CM

## 2022-05-19 DIAGNOSIS — F17200 Nicotine dependence, unspecified, uncomplicated: Secondary | ICD-10-CM

## 2022-05-19 DIAGNOSIS — L818 Other specified disorders of pigmentation: Secondary | ICD-10-CM

## 2022-05-19 DIAGNOSIS — F1911 Other psychoactive substance abuse, in remission: Secondary | ICD-10-CM

## 2022-05-19 DIAGNOSIS — M86141 Other acute osteomyelitis, right hand: Secondary | ICD-10-CM | POA: Diagnosis not present

## 2022-05-19 DIAGNOSIS — F17210 Nicotine dependence, cigarettes, uncomplicated: Secondary | ICD-10-CM | POA: Diagnosis not present

## 2022-05-19 DIAGNOSIS — F1120 Opioid dependence, uncomplicated: Secondary | ICD-10-CM

## 2022-05-19 HISTORY — PX: I & D EXTREMITY: SHX5045

## 2022-05-19 LAB — BASIC METABOLIC PANEL
Anion gap: 6 (ref 5–15)
BUN: 13 mg/dL (ref 6–20)
CO2: 28 mmol/L (ref 22–32)
Calcium: 9.1 mg/dL (ref 8.9–10.3)
Chloride: 104 mmol/L (ref 98–111)
Creatinine, Ser: 0.85 mg/dL (ref 0.61–1.24)
GFR, Estimated: >60 mL/min (ref >60)
Glucose, Bld: 104 mg/dL — ABNORMAL HIGH (ref 70–99)
Potassium: 4.2 mmol/L (ref 3.5–5.1)
Sodium: 138 mmol/L (ref 135–145)

## 2022-05-19 LAB — SEDIMENTATION RATE: Sed Rate: 17 mm/hr — ABNORMAL HIGH (ref 0–16)

## 2022-05-19 LAB — CBC
HCT: 44.2 % (ref 39.0–52.0)
Hemoglobin: 15.1 g/dL (ref 13.0–17.0)
MCH: 30.4 pg (ref 26.0–34.0)
MCHC: 34.2 g/dL (ref 30.0–36.0)
MCV: 88.9 fL (ref 80.0–100.0)
Platelets: 248 10*3/uL (ref 150–400)
RBC: 4.97 MIL/uL (ref 4.22–5.81)
RDW: 11.8 % (ref 11.5–15.5)
WBC: 7.2 10*3/uL (ref 4.0–10.5)
nRBC: 0 % (ref 0.0–0.2)

## 2022-05-19 LAB — SURGICAL PCR SCREEN
MRSA, PCR: POSITIVE — AB
Staphylococcus aureus: POSITIVE — AB

## 2022-05-19 LAB — HEPATITIS A ANTIBODY, TOTAL: hep A Total Ab: NONREACTIVE

## 2022-05-19 LAB — C-REACTIVE PROTEIN: CRP: 0.5 mg/dL (ref <1.0)

## 2022-05-19 LAB — HEPATITIS C ANTIBODY: HCV Ab: REACTIVE — AB

## 2022-05-19 LAB — HEPATITIS B SURFACE ANTIGEN: Hepatitis B Surface Ag: NONREACTIVE

## 2022-05-19 SURGERY — IRRIGATION AND DEBRIDEMENT EXTREMITY
Anesthesia: General | Site: Thumb | Laterality: Right

## 2022-05-19 MED ORDER — BACITRACIN ZINC 500 UNIT/GM EX OINT
TOPICAL_OINTMENT | CUTANEOUS | Status: DC | PRN
  Administered 2022-05-19: 1 via TOPICAL

## 2022-05-19 MED ORDER — ONDANSETRON HCL 4 MG/2ML IJ SOLN
INTRAMUSCULAR | Status: DC | PRN
  Administered 2022-05-19: 4 mg via INTRAVENOUS

## 2022-05-19 MED ORDER — CEFAZOLIN SODIUM 1 G IJ SOLR
INTRAMUSCULAR | Status: AC
  Filled 2022-05-19: qty 20

## 2022-05-19 MED ORDER — LIDOCAINE 2% (20 MG/ML) 5 ML SYRINGE
INTRAMUSCULAR | Status: DC | PRN
  Administered 2022-05-19: 80 mg via INTRAVENOUS

## 2022-05-19 MED ORDER — NICOTINE 21 MG/24HR TD PT24
21.0000 mg | MEDICATED_PATCH | Freq: Every day | TRANSDERMAL | Status: DC
  Administered 2022-05-20 – 2022-05-21 (×2): 21 mg via TRANSDERMAL
  Filled 2022-05-19 (×2): qty 1

## 2022-05-19 MED ORDER — LACTATED RINGERS IV SOLN: INTRAVENOUS | Status: DC

## 2022-05-19 MED ORDER — DEXMEDETOMIDINE HCL IN NACL 80 MCG/20ML IV SOLN
INTRAVENOUS | Status: DC | PRN
  Administered 2022-05-19 (×2): 12 ug via BUCCAL

## 2022-05-19 MED ORDER — KETOROLAC TROMETHAMINE 30 MG/ML IJ SOLN
INTRAMUSCULAR | Status: DC | PRN
  Administered 2022-05-19: 30 mg via INTRAVENOUS

## 2022-05-19 MED ORDER — ORAL CARE MOUTH RINSE: 15.0000 mL | Freq: Once | OROMUCOSAL | Status: AC

## 2022-05-19 MED ORDER — SODIUM CHLORIDE 0.9 % IR SOLN
Status: DC | PRN
  Administered 2022-05-19 (×3): 1000 mL

## 2022-05-19 MED ORDER — KETOROLAC TROMETHAMINE 30 MG/ML IJ SOLN
INTRAMUSCULAR | Status: AC
  Filled 2022-05-19: qty 1

## 2022-05-19 MED ORDER — OXYCODONE HCL 5 MG/5ML PO SOLN: 5.0000 mg | Freq: Once | ORAL | Status: DC | PRN

## 2022-05-19 MED ORDER — OXYCODONE HCL 5 MG PO TABS: 5.0000 mg | ORAL_TABLET | Freq: Once | ORAL | Status: DC | PRN

## 2022-05-19 MED ORDER — CHLORHEXIDINE GLUCONATE 0.12 % MT SOLN
15.0000 mL | Freq: Once | OROMUCOSAL | Status: AC
  Administered 2022-05-19: 15 mL via OROMUCOSAL

## 2022-05-19 MED ORDER — ENSURE PRE-SURGERY PO LIQD
296.0000 mL | Freq: Once | ORAL | Status: AC
  Administered 2022-05-19: 296 mL via ORAL
  Filled 2022-05-19: qty 296

## 2022-05-19 MED ORDER — MIDAZOLAM HCL 5 MG/5ML IJ SOLN
INTRAMUSCULAR | Status: DC | PRN
  Administered 2022-05-19: 2 mg via INTRAVENOUS

## 2022-05-19 MED ORDER — HYDROMORPHONE HCL 1 MG/ML IJ SOLN
INTRAMUSCULAR | Status: AC
  Filled 2022-05-19: qty 2

## 2022-05-19 MED ORDER — ONDANSETRON HCL 4 MG/2ML IJ SOLN
INTRAMUSCULAR | Status: AC
  Filled 2022-05-19: qty 2

## 2022-05-19 MED ORDER — HYDROMORPHONE HCL 1 MG/ML IJ SOLN
0.2500 mg | INTRAMUSCULAR | Status: DC | PRN
  Administered 2022-05-19 (×4): 0.5 mg via INTRAVENOUS

## 2022-05-19 MED ORDER — PROPOFOL 10 MG/ML IV BOLUS
INTRAVENOUS | Status: DC | PRN
  Administered 2022-05-19: 200 mg via INTRAVENOUS

## 2022-05-19 MED ORDER — HYDROMORPHONE HCL 1 MG/ML IJ SOLN
0.5000 mg | INTRAMUSCULAR | Status: AC | PRN
  Administered 2022-05-19 (×4): 0.5 mg via INTRAVENOUS

## 2022-05-19 MED ORDER — DEXAMETHASONE SODIUM PHOSPHATE 10 MG/ML IJ SOLN
INTRAMUSCULAR | Status: AC
  Filled 2022-05-19: qty 1

## 2022-05-19 MED ORDER — ONDANSETRON HCL 4 MG/2ML IJ SOLN: 4.0000 mg | Freq: Once | INTRAMUSCULAR | Status: DC | PRN

## 2022-05-19 MED ORDER — DEXAMETHASONE SODIUM PHOSPHATE 10 MG/ML IJ SOLN
INTRAMUSCULAR | Status: DC | PRN
  Administered 2022-05-19: 10 mg via INTRAVENOUS

## 2022-05-19 MED ORDER — BACITRACIN ZINC 500 UNIT/GM EX OINT
TOPICAL_OINTMENT | CUTANEOUS | Status: AC
  Filled 2022-05-19: qty 28.35

## 2022-05-19 MED ORDER — FENTANYL CITRATE (PF) 100 MCG/2ML IJ SOLN
INTRAMUSCULAR | Status: DC | PRN
  Administered 2022-05-19: 50 ug via INTRAVENOUS
  Administered 2022-05-19: 25 ug via INTRAVENOUS

## 2022-05-19 MED ORDER — FENTANYL CITRATE (PF) 250 MCG/5ML IJ SOLN
INTRAMUSCULAR | Status: AC
  Filled 2022-05-19: qty 5

## 2022-05-19 MED ORDER — DEXMEDETOMIDINE HCL IN NACL 80 MCG/20ML IV SOLN
INTRAVENOUS | Status: AC
  Filled 2022-05-19: qty 20

## 2022-05-19 MED ORDER — MIDAZOLAM HCL 2 MG/2ML IJ SOLN
INTRAMUSCULAR | Status: AC
  Filled 2022-05-19: qty 2

## 2022-05-19 MED ORDER — SODIUM CHLORIDE 0.9 % IV SOLN
8.0000 mg/kg | Freq: Every day | INTRAVENOUS | Status: DC
  Administered 2022-05-19 – 2022-05-20 (×2): 500 mg via INTRAVENOUS
  Filled 2022-05-19 (×3): qty 10

## 2022-05-19 MED ORDER — MUPIROCIN 2 % EX OINT
1.0000 | TOPICAL_OINTMENT | Freq: Two times a day (BID) | CUTANEOUS | Status: DC
  Administered 2022-05-19 – 2022-05-20 (×4): 1 via NASAL
  Filled 2022-05-19: qty 22

## 2022-05-19 MED ORDER — CEFAZOLIN SODIUM-DEXTROSE 2-3 GM-%(50ML) IV SOLR
INTRAVENOUS | Status: DC | PRN
  Administered 2022-05-19: 2 g via INTRAVENOUS

## 2022-05-19 SURGICAL SUPPLY — 59 items
BAG COUNTER SPONGE SURGICOUNT (BAG) ×2 IMPLANT
BAG SPNG CNTER NS LX DISP (BAG) ×1
BNDG CMPR 9X4 STRL LF SNTH (GAUZE/BANDAGES/DRESSINGS) ×1
BNDG COHESIVE 1X5 TAN STRL LF (GAUZE/BANDAGES/DRESSINGS) IMPLANT
BNDG CONFORM 2 STRL LF (GAUZE/BANDAGES/DRESSINGS) IMPLANT
BNDG ELASTIC 3X5.8 VLCR STR LF (GAUZE/BANDAGES/DRESSINGS) ×2 IMPLANT
BNDG ELASTIC 4X5.8 VLCR STR LF (GAUZE/BANDAGES/DRESSINGS) ×2 IMPLANT
BNDG ESMARK 4X9 LF (GAUZE/BANDAGES/DRESSINGS) ×2 IMPLANT
BNDG GAUZE DERMACEA FLUFF 4 (GAUZE/BANDAGES/DRESSINGS) ×2 IMPLANT
BNDG GZE DERMACEA 4 6PLY (GAUZE/BANDAGES/DRESSINGS) ×1
CORD BIPOLAR FORCEPS 12FT (ELECTRODE) ×2 IMPLANT
COVER SURGICAL LIGHT HANDLE (MISCELLANEOUS) ×2 IMPLANT
CUFF TOURN SGL QUICK 18X4 (TOURNIQUET CUFF) ×2 IMPLANT
CUFF TOURN SGL QUICK 24 (TOURNIQUET CUFF)
CUFF TRNQT CYL 24X4X16.5-23 (TOURNIQUET CUFF) IMPLANT
DRAIN PENROSE 1/4X12 LTX STRL (WOUND CARE) IMPLANT
DRAPE SURG 17X23 STRL (DRAPES) ×2 IMPLANT
DRSG ADAPTIC 3X8 NADH LF (GAUZE/BANDAGES/DRESSINGS) ×2 IMPLANT
DRSG EMULSION OIL 3X3 NADH (GAUZE/BANDAGES/DRESSINGS) IMPLANT
ELECT REM PT RETURN 9FT ADLT (ELECTROSURGICAL)
ELECTRODE REM PT RTRN 9FT ADLT (ELECTROSURGICAL) IMPLANT
GAUZE SPONGE 4X4 12PLY STRL (GAUZE/BANDAGES/DRESSINGS) ×2 IMPLANT
GAUZE XEROFORM 1X8 LF (GAUZE/BANDAGES/DRESSINGS) ×2 IMPLANT
GAUZE XEROFORM 5X9 LF (GAUZE/BANDAGES/DRESSINGS) IMPLANT
GLOVE BIO SURGEON STRL SZ7.5 (GLOVE) ×2 IMPLANT
GLOVE BIOGEL PI IND STRL 7.5 (GLOVE) ×2 IMPLANT
GOWN STRL REUS W/ TWL LRG LVL3 (GOWN DISPOSABLE) ×6 IMPLANT
GOWN STRL REUS W/ TWL XL LVL3 (GOWN DISPOSABLE) ×2 IMPLANT
GOWN STRL REUS W/TWL LRG LVL3 (GOWN DISPOSABLE) ×3
GOWN STRL REUS W/TWL XL LVL3 (GOWN DISPOSABLE) ×1
HANDPIECE INTERPULSE COAX TIP (DISPOSABLE)
KIT BASIN OR (CUSTOM PROCEDURE TRAY) ×2 IMPLANT
KIT TURNOVER KIT B (KITS) ×2 IMPLANT
MANIFOLD NEPTUNE II (INSTRUMENTS) ×2 IMPLANT
NDL HYPO 25GX1X1/2 BEV (NEEDLE) IMPLANT
NEEDLE HYPO 25GX1X1/2 BEV (NEEDLE) IMPLANT
NS IRRIG 1000ML POUR BTL (IV SOLUTION) ×2 IMPLANT
PACK ORTHO EXTREMITY (CUSTOM PROCEDURE TRAY) ×2 IMPLANT
PAD ARMBOARD 7.5X6 YLW CONV (MISCELLANEOUS) ×4 IMPLANT
PAD CAST 4YDX4 CTTN HI CHSV (CAST SUPPLIES) ×2 IMPLANT
PADDING CAST COTTON 4X4 STRL (CAST SUPPLIES) ×1
SET CYSTO W/LG BORE CLAMP LF (SET/KITS/TRAYS/PACK) IMPLANT
SET HNDPC FAN SPRY TIP SCT (DISPOSABLE) IMPLANT
SOAP 2 % CHG 4 OZ (WOUND CARE) ×2 IMPLANT
SPONGE T-LAP 18X18 ~~LOC~~+RFID (SPONGE) ×2 IMPLANT
SPONGE T-LAP 4X18 ~~LOC~~+RFID (SPONGE) ×2 IMPLANT
SUT CHROMIC 4 0 PS 5 (SUTURE) IMPLANT
SUT ETHILON 4 0 PS 2 18 (SUTURE) IMPLANT
SUT ETHILON 5 0 P 3 18 (SUTURE)
SUT NYLON ETHILON 5-0 P-3 1X18 (SUTURE) IMPLANT
SWAB COLLECTION DEVICE MRSA (MISCELLANEOUS) ×2 IMPLANT
SWAB CULTURE ESWAB REG 1ML (MISCELLANEOUS) IMPLANT
SYR CONTROL 10ML LL (SYRINGE) IMPLANT
TOWEL GREEN STERILE (TOWEL DISPOSABLE) ×2 IMPLANT
TOWEL GREEN STERILE FF (TOWEL DISPOSABLE) ×2 IMPLANT
TUBE CONNECTING 12X1/4 (SUCTIONS) ×2 IMPLANT
UNDERPAD 30X36 HEAVY ABSORB (UNDERPADS AND DIAPERS) ×2 IMPLANT
WATER STERILE IRR 1000ML POUR (IV SOLUTION) ×2 IMPLANT
YANKAUER SUCT BULB TIP NO VENT (SUCTIONS) ×2 IMPLANT
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
## 2022-05-19 NOTE — TOC Initial Note (Cosign Needed Addendum)
Transition of Care (TOC) - Initial/Assessment Note   Spoke to patient at bedside. Patient from home with roommate .   Address and phone number in computer correct.   PCP is at [facility], he is not sure of provider's name. [facility]. Patient's PCP is Hubbard Robinson . Updated chart   Patient has transportation to appointments and can get medications.                    Barriers to Discharge: Continued Medical Work up   Patient Goals and CMS Choice Patient states their goals for this hospitalization and ongoing recovery are:: to return to home          Expected Discharge Plan and Services   Discharge Planning Services: CM Consult   Living arrangements for the past 2 months: Apartment                 DME Arranged: N/A                    Prior Living Arrangements/Services Living arrangements for the past 2 months: Apartment Lives with:: Roommate Patient language and need for interpreter reviewed:: Yes Do you feel safe going back to the place where you live?: Yes            Criminal Activity/Legal Involvement Pertinent to Current Situation/Hospitalization: No - Comment as needed  Activities of Daily Living Home Assistive Devices/Equipment: None ADL Screening (condition at time of admission) Patient's cognitive ability adequate to safely complete daily activities?: Yes Is the patient deaf or have difficulty hearing?: No Does the patient have difficulty seeing, even when wearing glasses/contacts?: No Does the patient have difficulty concentrating, remembering, or making decisions?: No Patient able to express need for assistance with ADLs?: Yes Does the patient have difficulty dressing or bathing?: No Independently performs ADLs?: Yes (appropriate for developmental age) Does the patient have difficulty walking or climbing stairs?: No Weakness of Legs: None Weakness of Arms/Hands: None  Permission Sought/Granted   Permission granted to  share information with : No              Emotional Assessment Appearance:: Appears stated age Attitude/Demeanor/Rapport: Engaged Affect (typically observed): Accepting Orientation: : Oriented to Self, Oriented to Place, Oriented to  Time, Oriented to Situation   Psych Involvement: No (comment)  Admission diagnosis:  Osteomyelitis of right hand [facility]) [M86.9] Patient Active Problem List   Diagnosis Date Noted   Osteomyelitis of right hand ([place]) 05/18/2022   Long-term current use of methadone for opiate dependence ([place]) 05/18/2022   Tobacco dependence 05/18/2022   PCP:  Patient, No Pcp Per Pharmacy:   [id number] - [place], [place] - [address] AT [place] [address] [place] [address] Phone: [phone] Fax: [phone]     Social Determinants of Health ([place]) Social History: [place]: Unknown (05/18/2022)  Housing: Low Risk  (05/18/2022)  Transportation Needs: No Transportation Needs (05/18/2022)  Utilities: Not At Risk (05/18/2022)  Tobacco Use: High Risk (05/18/2022)   SDOH Interventions: Housing Interventions: Patient Refused   Readmission Risk Interventions     No data to display

## 2022-05-19 NOTE — Care Plan (Signed)
Report called to short stay/pre op.  Patient removed briefs, jogging pants.  All belongins placed in top drawer, (keys, phone, earbuds, hoodie, sweat pants).  Pt taken to OR. NADN. Sreece, RN

## 2022-05-19 NOTE — Anesthesia Preprocedure Evaluation (Signed)
Anesthesia Evaluation  Patient identified by MRN, date of birth, ID band Patient awake    Reviewed: Allergy & Precautions, NPO status , Patient's Chart, lab work & pertinent test results  Airway Mallampati: II  TM Distance: >3 FB     Dental  (+) Edentulous Upper, Edentulous Lower   Pulmonary asthma , Current Smoker and Patient abstained from smoking.   Pulmonary exam normal breath sounds clear to auscultation       Cardiovascular Normal cardiovascular exam Rhythm:Regular Rate:Normal     Neuro/Psych negative neurological ROS  negative psych ROS   GI/Hepatic ,,,(+)     substance abuse    Endo/Other  negative endocrine ROS    Renal/GU negative Renal ROS  negative genitourinary   Musculoskeletal  (+)  narcotic dependentInfected right thumb   Abdominal   Peds  Hematology negative hematology ROS (+)   Anesthesia Other Findings   Reproductive/Obstetrics                              Anesthesia Physical Anesthesia Plan  ASA: 2  Anesthesia Plan: General   Post-op Pain Management: Dilaudid IV   Induction: Intravenous  PONV Risk Score and Plan: 2 and Treatment may vary due to age or medical condition, Ondansetron and Midazolam  Airway Management Planned: LMA  Additional Equipment: None  Intra-op Plan:   Post-operative Plan:   Informed Consent: I have reviewed the patients History and Physical, chart, labs and discussed the procedure including the risks, benefits and alternatives for the proposed anesthesia with the patient or authorized representative who has indicated his/her understanding and acceptance.     Dental advisory given  Plan Discussed with: CRNA and Anesthesiologist  Anesthesia Plan Comments:          Anesthesia Quick Evaluation

## 2022-05-19 NOTE — Anesthesia Procedure Notes (Signed)
Procedure Name: LMA Insertion Date/Time: 05/19/2022 2:04 PM  Performed by: Genelle Bal, CRNAPre-anesthesia Checklist: Patient identified, Emergency Drugs available, Suction available and Patient being monitored Patient Re-evaluated:Patient Re-evaluated prior to induction Oxygen Delivery Method: Circle system utilized Preoxygenation: Pre-oxygenation with 100% oxygen Induction Type: IV induction Ventilation: Mask ventilation without difficulty LMA: LMA with gastric port inserted LMA Size: 4.0 Number of attempts: 1 Airway Equipment and Method: Bite block Placement Confirmation: positive ETCO2 Tube secured with: Tape Dental Injury: Teeth and Oropharynx as per pre-operative assessment

## 2022-05-19 NOTE — Transfer of Care (Signed)
Immediate Anesthesia Transfer of Care Note  Patient: Timothy Smith  Procedure(s) Performed: IRRIGATION AND DEBRIDEMENT RIGHT THUMB WITH CULTURE (Right: Thumb)  Patient Location: PACU  Anesthesia Type:General  Level of Consciousness: awake, alert , and oriented  Airway & Oxygen Therapy: Patient Spontanous Breathing  Post-op Assessment: Report given to RN, Post -op Vital signs reviewed and stable, Patient moving all extremities X 4, and Patient able to stick tongue midline  Post vital signs: Reviewed  Last Vitals:  Vitals Value Taken Time  BP 128/85 05/19/22 1500  Temp 36.8 C 05/19/22 1500  Pulse 95 05/19/22 1500  Resp 12 05/19/22 1500  SpO2 100 % 05/19/22 1500  Vitals shown include unvalidated device data.  Last Pain:  Vitals:   05/19/22 1313  TempSrc:   PainSc: 8       Patients Stated Pain Goal: 4 (60/60/04 5997)  Complications: No notable events documented.

## 2022-05-19 NOTE — Progress Notes (Addendum)
PROGRESS NOTE    Timothy Smith  YTK:[id number] DOB: 1985-12-06 DOA: 05/18/2022 PCP: Patient, No Pcp Per   Brief Narrative:  HPI per Dr. Karmen Bongo: Timothy Smith is a 37 y.o. male with medical history significant of opioid dependence on methadone presenting with a thumb infection.  He reports a thumb infection with severe pain.  He noticed an issue about a week or so ago.  He was working on a car but didn't notice an injury.  Finger feels warm but no fever.   He feels like the Vanc is making him feel "fuzzy, prickly all over."  No prior h/o similar.   ER Course:  DB to [facility] transfer, per Dr. Velia Meyer:   Several days of right thumb pain, swelling, erythema, after potentially incurring an abrasion of the right thumb while working on his car 2 to 3 weeks ago.  No acute sensory deficits or diminished range of motion.  Erythema/swelling did not extend to the level of the wrist, no  diminished wrist flexion/extension.  Plain films of the right hand were suggestive of potential osteomyelitis involving the distal phalanx of the thumb. VSS.  Given IV Vanc.  **Interim History Patient has been seen by infectious diseases who recommends incision and drainage.  The hand surgeon has seen the patient has taken him to the OR for I&D.  Will need to follow cultures and placed on appropriate antibiotic therapy  Assessment and Plan: No notes have been filed under this hospital service. Service: Hospitalist  Osteomyelitis of the right thumb -R thumb infection distally -d/w Silvestre Gunner, he will d/w ortho/hand and hand Dr. Greta Doom has seen the patient and he is taken for right thumb I&D and opening of the bone cortex distal phalanx for osteomyelitis as well as right thumb nail plate removal and nailbed repair -Transfer to [facility] -ID consult upon arrival - recommends holding abx for now and I spoke with Dr. Tommy Medal as well -ID recommends holding antibiotics today and recommending surgical intervention to  obtain deep operative cultures and checking ESR CRP -Check Blood Cx x2 -WBC trend: Recent Labs  Lab 05/18/22 0501 05/19/22 0535  WBC 8.3 7.2  -ID also recommends that the patient may benefit from MRI of the fingers and hand but not wanting to wear this at this point given that the patient is going to the OR -Currently getting LR at 75 MLS per hour -X-Ray of the Right Hand and Thumb showed "Soft tissue swelling at the right thumb. No soft tissue gas. No radiopaque foreign body identified. But there is aggressive osteolysis involving up to 5 mm of the tuft of the thumb distal phalanx consistent with osteomyelitis. The thumb IP joint is spared. No superimposed fracture or dislocation."   Opiate Dependence -Continue home Methadone -This may make his pain more difficult to control -C/w Acetaminophen 650 mg po/RC q6prn Mild Pain, Morphine 2 mg IV q2hprn Severe Pain and Oxycodone    Tobacco Dependence -Encourage cessation.   -This was discussed with the patient and should be reviewed on an ongoing basis.   -Nicotine Patch 14 mg TD was ordered but increased to 21 mg TD  History of polysubstance abuse including IV heroin -On opiate replacement as above and claims that he has been clean and sober for nearly 10 years ago but does admit to have some intermittent smoking methamphetamines a few years ago -ID has been consulted and recommending screening for hep B, C, A as well as checking ESR and CRP  Hyponatremia -  Mild and Improved. Na+ Trend: Recent Labs  Lab 05/18/22 0501 05/19/22 0535  NA 134* 138  -Continue to Monitor and Trend and repeat CMP in the AM   Hyperglycemia in setting of Prediabetes -Blood Sugars ranging from 104-125 -Checked hemoglobin A1c was 5.7 -Continue to Monitor Blood Sugars and if Necessary place on Sensitive Novolog SSI AC  DVT prophylaxis: enoxaparin (LOVENOX) injection 40 mg Start: 05/18/22 1000    Code Status: Full Code Family Communication: No family  currently at bedside  Disposition Plan:  Level of care: Med-Surg Status is: Inpatient Remains inpatient appropriate because: Is going for surgical intervention today needs further clearance by ID and antibiotic management as well as clearance by hand surgery   Consultants:  Infectious diseases Hand surgery  Procedures:  As delineated as above is status post: Right thumb I&D and opening bone cortex distal phalanx for osteomyelitis Right thumb nail plate removal and nailbed repair  Antimicrobials:  Anti-infectives (From admission, onward)    Start     Dose/Rate Route Frequency Ordered Stop   05/18/22 0545  vancomycin (VANCOCIN) IVPB 1000 mg/200 mL premix        1,000 mg 200 mL/hr over 60 Minutes Intravenous  Once 05/18/22 0537 05/18/22 0650       Subjective: Seen and examined at bedside he was complaining of hand pain and wanting a cigarette.  States that he has not smoked in 2 days and states that he is getting very antsy and states that he did not sleep very good last night.  No nausea or vomiting.  Denies any chest pain or shortness of breath.  No other concerns or complaints at this time.  Objective: Vitals:   05/18/22 1345 05/18/22 1400 05/18/22 2023 05/19/22 0748  BP:  130/88 131/81 130/86  Pulse: 60 62 82 65  Resp:   16 17  Temp:   98.7 F (37.1 C) 98.1 F (36.7 C)  TempSrc:   Oral Oral  SpO2: 98% 100% 98% 97%  Weight:      Height:        Intake/Output Summary (Last 24 hours) at 05/19/2022 0854 Last data filed at 05/19/2022 0747 Gross per 24 hour  Intake 2351.62 ml  Output --  Net 2351.62 ml   Filed Weights   05/18/22 0341  Weight: 63.5 kg   Examination: Physical Exam:  Constitutional: Thin chronically ill appearing Caucasian male in slight distress complaining of some hand pain Respiratory: Diminished to auscultation bilaterally, no wheezing, rales, rhonchi or crackles. Normal respiratory effort and patient is not tachypenic. No accessory muscle use.   Unlabored breathing Cardiovascular: RRR, no murmurs / rubs / gallops. S1 and S2 auscultated. No extremity edema.  Abdomen: Soft, non-tender, non-distended. Bowel sounds positive.  GU: Deferred. Musculoskeletal: No clubbing / cyanosis of digits/nails. No joint deformity upper and lower extremities.   Skin: No rashes, lesions, ulcers on limited skin evaluation. No induration; Warm and dry.  Neurologic: CN 2-12 grossly intact with no focal deficits. Romberg sign cerebellar reflexes not assessed.  Psychiatric: Normal judgment and insight. Alert and oriented x 3.  Slightly anxious mood  Data Reviewed: I have personally reviewed following labs and imaging studies  CBC: Recent Labs  Lab 05/18/22 0501 05/19/22 0535  WBC 8.3 7.2  NEUTROABS 4.3  --   HGB 15.7 15.1  HCT 45.2 44.2  MCV 86.4 88.9  PLT 261 009   Basic Metabolic Panel: Recent Labs  Lab 05/18/22 0501 05/19/22 0535  NA 134* 138  K 3.9  4.2  CL 101 104  CO2 26 28  GLUCOSE 125* 104*  BUN 21* 13  CREATININE 0.92 0.85  CALCIUM 9.0 9.1   GFR: Estimated Creatinine Clearance: 108.9 mL/min (by C-G formula based on SCr of 0.85 mg/dL). Liver Function Tests: No results for input(s): "AST", "ALT", "ALKPHOS", "BILITOT", "PROT", "ALBUMIN" in the last 168 hours. No results for input(s): "LIPASE", "AMYLASE" in the last 168 hours. No results for input(s): "AMMONIA" in the last 168 hours. Coagulation Profile: No results for input(s): "INR", "PROTIME" in the last 168 hours. Cardiac Enzymes: No results for input(s): "CKTOTAL", "CKMB", "CKMBINDEX", "TROPONINI" in the last 168 hours. BNP (last 3 results) No results for input(s): "PROBNP" in the last 8760 hours. HbA1C: Recent Labs    05/18/22 0807  HGBA1C 5.7*   CBG: No results for input(s): "GLUCAP" in the last 168 hours. Lipid Profile: No results for input(s): "CHOL", "HDL", "LDLCALC", "TRIG", "CHOLHDL", "LDLDIRECT" in the last 72 hours. Thyroid Function Tests: No results for  input(s): "TSH", "T4TOTAL", "FREET4", "T3FREE", "THYROIDAB" in the last 72 hours. Anemia Panel: No results for input(s): "VITAMINB12", "FOLATE", "FERRITIN", "TIBC", "IRON", "RETICCTPCT" in the last 72 hours. Sepsis Labs: No results for input(s): "PROCALCITON", "LATICACIDVEN" in the last 168 hours.  Recent Results (from the past 240 hour(s))  Surgical PCR screen     Status: Abnormal   Collection Time: 05/19/22  3:51 AM   Specimen: Nasal Mucosa; Nasal Swab  Result Value Ref Range Status   MRSA, PCR POSITIVE (A) NEGATIVE Final    Comment: RESULT CALLED TO, READ BACK BY AND VERIFIED WITH: COLLIER RN 05/19/22 @ 0548 BY AB    Staphylococcus aureus POSITIVE (A) NEGATIVE Final    Comment: (NOTE) The Xpert SA Assay (FDA approved for NASAL specimens in patients 2 years of age and older), is one component of a comprehensive surveillance program. It is not intended to diagnose infection nor to guide or monitor treatment. Performed at [facility], [place] [address], [place], [place] [address]     Radiology Studies: DG Finger Thumb Right  Result Date: 05/18/2022 CLINICAL DATA:  37 year old male with thumb injury 2 weeks ago. Redness and swelling now. EXAM: RIGHT THUMB 2+V COMPARISON:  None Available. FINDINGS: Soft tissue swelling at the right thumb. No soft tissue gas. No radiopaque foreign body identified. But there is aggressive osteolysis involving up to 5 mm of the tuft of the thumb distal phalanx consistent with osteomyelitis. The thumb IP joint is spared. No superimposed fracture or dislocation. IMPRESSION: Positive for OSTEOMYELITIS of the tuft of the thumb distal phalanx. Regional soft tissue swelling. No radiopaque foreign body. Electronically Signed   By: Genevie Ann M.D.   On: 05/18/2022 04:16    Scheduled Meds:  enoxaparin (LOVENOX) injection  40 mg Subcutaneous Q24H   feeding supplement  296 mL Oral Once   methadone  100 mg Oral Daily   mupirocin ointment  1 Application Nasal BID    nicotine  14 mg Transdermal Daily   Continuous Infusions:  lactated ringers 75 mL/hr at 05/18/22 2145    LOS: 1 day   Raiford Noble, DO [facility] Available via Epic secure chat 7am-7pm After these hours, please refer to coverage provider listed on [email or web address] 05/19/2022, 8:54 AM

## 2022-05-19 NOTE — Progress Notes (Signed)
Pharmacy Antibiotic Note  Timothy Smith is a 37 y.o. male admitted on 05/18/2022 presenting with thumb infection with osteomyelitis, now s/p I&D and Cx, ID following.  Pharmacy has been consulted for daptomycin dosing.  Plan: Daptomycin 500 mg IV q 24h Monitor renal function, CK, and Cx and ID recs  Height: 5\' 11"  (180.3 cm) Weight: 63.5 kg (140 lb) IBW/kg (Calculated) : 75.3  Temp (24hrs), Avg:98.3 F (36.8 C), Min:97.6 F (36.4 C), Max:98.7 F (37.1 C)  Recent Labs  Lab 05/18/22 0501 05/19/22 0535  WBC 8.3 7.2  CREATININE 0.92 0.85    Estimated Creatinine Clearance: 108.9 mL/min (by C-G formula based on SCr of 0.85 mg/dL).    No Known Allergies  Bertis Ruddy, PharmD, [facility] Clinical Pharmacist ED Pharmacist Phone # [phone] 05/19/2022 5:26 PM

## 2022-05-19 NOTE — Op Note (Signed)
OPERATIVE NOTE  DATE OF PROCEDURE: 05/19/2022  SURGEONS:  Primary: Orene Desanctis, MD  PREOPERATIVE DIAGNOSIS: osteomyelitis right thumb, nailbed laceration  POSTOPERATIVE DIAGNOSIS: Same  NAME OF PROCEDURE:   Right thumb I&D and opening bone cortex distal phalanx for osteomyelitis Right thumb nail plate removal and nailbed repair  ANESTHESIA: General  SKIN PREPARATION: Hibiclens  ESTIMATED BLOOD LOSS: Minimal  IMPLANTS: none  INDICATIONS:  Timothy Smith is a 37 y.o. male who has the above preoperative diagnosis. The patient has decided to proceed with surgical intervention.  Risks, benefits and alternatives of operative management were discussed including, but not limited to, risks of anesthesia complications, infection, pain, persistent symptoms, stiffness, need for future surgery.  The patient understands, agrees and elects to proceed with surgery.    DESCRIPTION OF PROCEDURE: The patient was met in the pre-operative area and their identity was verified.  The operative location and laterality was also verified and marked.  The patient was brought to the OR and was placed supine on the table.  After repeat patient identification with the operative team anesthesia was provided and the patient was prepped and draped in the usual sterile fashion.  A final timeout was performed verifying the correction patient, procedure, location and laterality.  Operative antibiotics were held.  The right upper extremity was elevated and tourniquet inflated to 250 mmHg at the right forearm.  The procedure began by removing the nail plate with a freer elevator.  There was immediately purulence identified from this area and this was cultured.  There were 2 soft tissue defects within the distal aspect of the sterile matrix of the nail bed of the right thumb along the ulnar aspect at the distalmost point of the thumb.  This area was opened with a 15 blade scalpel and immediately purulence was identified.  This  directly communicated down to the distal phalanx of the right thumb.  Once dissection was carried down to the distal phalanx cultures were obtained from the bone and bone biopsies were also obtained for pathology.  Thorough irrigation and debridement with 3 L of normal saline and debridement with rongeur and curettes was performed to the distalmost aspect of the distal phalanx until healthy bleeding bone appeared with no further purulence there was purulence within the bone itself this was scraped clean until there was no remaining signs of infection low flow cystoscopy tubing was utilized for irrigation with a total of 3 L.  The sterile matrix was then trimmed back more proximally where the unsupported nail matrix was located due to the debridement of the bone the sterile matrix was trimmed back to this area and then a nailbed repair was performed with 4-0 chromic sutures.  This was performed loosely.  There is no further signs of infection within the pulp no effusion of the IP joint.  No fluctuance more proximally along the flexor or extensor tendon sheaths.  Sterile bandage was applied.  Prior to final application of the bandage the tourniquet was deflated and the thumb was pink and warm well-perfused with brisk cap refill to the tip of the thumb.  All counts were correct x 2.  The patient tolerated the procedure well he was awoken from anesthesia and brought to PACU for recovery in stable condition.  Postoperative plan: Will follow-up intraoperative cultures and bone biopsy.  A culture was taken superficially from just beneath the nail as well as from deep down within the bone.  A separate bone biopsy was performed and sent to pathology.  The  patient will start on IV antibiotics per medicine and infectious disease recommendations.  He was given a dose of Ancef after the cultures were obtained in the operating room.  The patient will change the bandage daily we will clean the wound with some saline work on  range of motion of the thumb with particular emphasis on flexion extension at the IP joint.  The dressing can be changed as needed.  We will tailor the antibiotics to the cultures that were obtained and the patient will follow up with me in 1 week in the office upon discharge from the hospital.   Matt Holmes, MD

## 2022-05-20 ENCOUNTER — Encounter (HOSPITAL_COMMUNITY): Payer: Self-pay | Admitting: Orthopedic Surgery

## 2022-05-20 DIAGNOSIS — Z22322 Carrier or suspected carrier of Methicillin resistant Staphylococcus aureus: Secondary | ICD-10-CM | POA: Diagnosis not present

## 2022-05-20 DIAGNOSIS — F1991 Other psychoactive substance use, unspecified, in remission: Secondary | ICD-10-CM

## 2022-05-20 DIAGNOSIS — R768 Other specified abnormal immunological findings in serum: Secondary | ICD-10-CM

## 2022-05-20 DIAGNOSIS — F17210 Nicotine dependence, cigarettes, uncomplicated: Secondary | ICD-10-CM | POA: Diagnosis not present

## 2022-05-20 DIAGNOSIS — F1120 Opioid dependence, uncomplicated: Secondary | ICD-10-CM | POA: Diagnosis not present

## 2022-05-20 DIAGNOSIS — M86141 Other acute osteomyelitis, right hand: Secondary | ICD-10-CM | POA: Diagnosis not present

## 2022-05-20 LAB — COMPREHENSIVE METABOLIC PANEL
ALT: 45 U/L — ABNORMAL HIGH (ref 0–44)
AST: 28 U/L (ref 15–41)
Albumin: 3.1 g/dL — ABNORMAL LOW (ref 3.5–5.0)
Alkaline Phosphatase: 74 U/L (ref 38–126)
Anion gap: 10 (ref 5–15)
BUN: 20 mg/dL (ref 6–20)
CO2: 26 mmol/L (ref 22–32)
Calcium: 8.6 mg/dL — ABNORMAL LOW (ref 8.9–10.3)
Chloride: 99 mmol/L (ref 98–111)
Creatinine, Ser: 1.3 mg/dL — ABNORMAL HIGH (ref 0.61–1.24)
GFR, Estimated: >60 mL/min (ref >60)
Glucose, Bld: 202 mg/dL — ABNORMAL HIGH (ref 70–99)
Potassium: 4 mmol/L (ref 3.5–5.1)
Sodium: 135 mmol/L (ref 135–145)
Total Bilirubin: 0.5 mg/dL (ref 0.3–1.2)
Total Protein: 6 g/dL — ABNORMAL LOW (ref 6.5–8.1)

## 2022-05-20 LAB — CBC WITH DIFFERENTIAL/PLATELET
Abs Immature Granulocytes: 0.02 10*3/uL (ref 0.00–0.07)
Basophils Absolute: 0 10*3/uL (ref 0.0–0.1)
Basophils Relative: 0 %
Eosinophils Absolute: 0 10*3/uL (ref 0.0–0.5)
Eosinophils Relative: 0 %
HCT: 39 % (ref 39.0–52.0)
Hemoglobin: 13.5 g/dL (ref 13.0–17.0)
Immature Granulocytes: 0 %
Lymphocytes Relative: 15 %
Lymphs Abs: 1.3 10*3/uL (ref 0.7–4.0)
MCH: 30.3 pg (ref 26.0–34.0)
MCHC: 34.6 g/dL (ref 30.0–36.0)
MCV: 87.4 fL (ref 80.0–100.0)
Monocytes Absolute: 0.5 10*3/uL (ref 0.1–1.0)
Monocytes Relative: 6 %
Neutro Abs: 6.8 10*3/uL (ref 1.7–7.7)
Neutrophils Relative %: 79 %
Platelets: 247 10*3/uL (ref 150–400)
RBC: 4.46 MIL/uL (ref 4.22–5.81)
RDW: 11.4 % — ABNORMAL LOW (ref 11.5–15.5)
WBC: 8.6 10*3/uL (ref 4.0–10.5)
nRBC: 0 % (ref 0.0–0.2)

## 2022-05-20 LAB — PHOSPHORUS: Phosphorus: 3 mg/dL (ref 2.5–4.6)

## 2022-05-20 LAB — SURGICAL PATHOLOGY

## 2022-05-20 LAB — MAGNESIUM: Magnesium: 2 mg/dL (ref 1.7–2.4)

## 2022-05-20 LAB — CK: Total CK: 54 U/L (ref 49–397)

## 2022-05-20 MED ORDER — HEPATITIS A VACCINE 1440 EL U/ML IM SUSP
1.0000 mL | Freq: Once | INTRAMUSCULAR | Status: DC
  Filled 2022-05-20: qty 1

## 2022-05-20 NOTE — Addendum Note (Signed)
Addendum  created 05/20/22 1451 by Josephine Igo, MD   [place] recorded in [place], [place] filed

## 2022-05-20 NOTE — Progress Notes (Signed)
PROGRESS NOTE  Timothy Smith XFG:[id number] DOB: [birth year] DOA: 05/18/2022 PCP: Penni Bombard, PA   LOS: 2 days   Brief Narrative / Interim history: 37 year old male with prior opioid dependence, currently on methadone into the hospital with a thumb infection.  Imaging showed right thumb osteomyelitis.  ID as well as hand surgery were consulted, and patient was taken to the OR on 1/30 and is status post I&D.  Following surgery he was started on antibiotics by ID  Subjective / 24h Interval events: Doing well today, right thumb pain controlled.  Assesement and Plan: Principal Problem:   Osteomyelitis of right hand ([place]) Active Problems:   Long-term current use of methadone for opiate dependence ([place])   Tobacco dependence   Principal problem Right thumb osteomyelitis -appreciate hand surgery input, he is status post I&D in the OR on 1/30.  Initially he was observed off antibiotics but now started on daptomycin following the surgery.  Blood cultures have remained negative.  Intraoperative cultures are showing GPC in pairs and clusters, and abundant Staph aureus.  Antibiotics per ID  Active problems Acute kidney injury -creatinine up today to 1.3, likely postoperative.  Before the surgery it was 0.85.  Encourage p.o. intake.  He is not hypotensive  Opioid use-currently stable on methadone, continue  Tobacco use-recommend cessation after discharge  Scheduled Meds:  enoxaparin (LOVENOX) injection  40 mg Subcutaneous Q24H   methadone  100 mg Oral Daily   mupirocin ointment  1 Application Nasal BID   nicotine  21 mg Transdermal Daily   Continuous Infusions:  DAPTOmycin (CUBICIN) 500 mg in sodium chloride 0.9 % IVPB Stopped (05/19/22 2009)   lactated ringers 75 mL/hr at 05/20/22 0416   PRN Meds:.acetaminophen **OR** acetaminophen, bisacodyl, hydrALAZINE, morphine injection, ondansetron **OR** ondansetron (ZOFRAN) IV, oxyCODONE, polyethylene glycol  Current Outpatient  Medications  Medication Instructions   methadone (DOLOPHINE) 100 mg, Oral, Daily, Mix with water (1 oz) prior to taking.    Diet Orders (From admission, onward)     Start     Ordered   05/19/22 1755  Diet regular Room service appropriate? Yes; Fluid consistency: Thin  Diet effective now       Question Answer Comment  Room service appropriate? Yes   Fluid consistency: Thin      05/19/22 1754            DVT prophylaxis: enoxaparin (LOVENOX) injection 40 mg Start: 05/18/22 1000   Lab Results  Component Value Date   PLT 247 05/20/2022      Code Status: Full Code  Family Communication: no family at bedside   Status is: Inpatient  Remains inpatient appropriate because: waiting micro cultures  Level of care: Med-Surg  Consultants:  ID Hand surgery   Objective: Vitals:   05/19/22 1751 05/19/22 2107 05/20/22 0543 05/20/22 0737  BP: (!) 152/91 (!) 143/91 126/77 113/71  Pulse: 78 92 68 74  Resp: 17 17 17 16   Temp: 98.3 F (36.8 C) 98.7 F (37.1 C) 98.4 F (36.9 C) 98.4 F (36.9 C)  TempSrc: Oral Oral Oral Oral  SpO2: 99% 97% 95% 95%  Weight:      Height:        Intake/Output Summary (Last 24 hours) at 05/20/2022 1402 Last data filed at 05/20/2022 0416 Gross per 24 hour  Intake 1850 ml  Output 1 ml  Net 1849 ml   Wt Readings from Last 3 Encounters:  05/19/22 63.5 kg  03/23/22 63.5 kg  08/22/21 68 kg  Examination:  Constitutional: NAD Eyes: no scleral icterus ENMT: Mucous membranes are moist.  Neck: normal, supple Respiratory: clear to auscultation bilaterally, no wheezing, no crackles. Normal respiratory effort. No accessory muscle use.  Cardiovascular: Regular rate and rhythm, no murmurs / rubs / gallops. No LE edema.  Abdomen: non distended, no tenderness. Bowel sounds positive.  Musculoskeletal: no clubbing / cyanosis.   Data Reviewed: I have independently reviewed following labs and imaging studies   CBC Recent Labs  Lab 05/18/22 0501  05/19/22 0535 05/20/22 0312  WBC 8.3 7.2 8.6  HGB 15.7 15.1 13.5  HCT 45.2 44.2 39.0  PLT 261 248 247  MCV 86.4 88.9 87.4  MCH 30.0 30.4 30.3  MCHC 34.7 34.2 34.6  RDW 11.7 11.8 11.4*  LYMPHSABS 3.1  --  1.3  MONOABS 0.6  --  0.5  EOSABS 0.2  --  0.0  BASOSABS 0.0  --  0.0    Recent Labs  Lab 05/18/22 0501 05/18/22 0807 05/19/22 0535 05/19/22 1750 05/20/22 0312  NA 134*  --  138  --  135  K 3.9  --  4.2  --  4.0  CL 101  --  104  --  99  CO2 26  --  28  --  26  GLUCOSE 125*  --  104*  --  202*  BUN 21*  --  13  --  20  CREATININE 0.92  --  0.85  --  1.30*  CALCIUM 9.0  --  9.1  --  8.6*  AST  --   --   --   --  28  ALT  --   --   --   --  45*  ALKPHOS  --   --   --   --  74  BILITOT  --   --   --   --  0.5  ALBUMIN  --   --   --   --  3.1*  MG  --   --   --   --  2.0  CRP  --   --   --  0.5  --   HGBA1C  --  5.7*  --   --   --     ------------------------------------------------------------------------------------------------------------------ No results for input(s): "CHOL", "HDL", "LDLCALC", "TRIG", "CHOLHDL", "LDLDIRECT" in the last 72 hours.  Lab Results  Component Value Date   HGBA1C 5.7 (H) 05/18/2022   ------------------------------------------------------------------------------------------------------------------ No results for input(s): "TSH", "T4TOTAL", "T3FREE", "THYROIDAB" in the last 72 hours.  Invalid input(s): "FREET3"  Cardiac Enzymes No results for input(s): "CKMB", "TROPONINI", "MYOGLOBIN" in the last 168 hours.  Invalid input(s): "CK" ------------------------------------------------------------------------------------------------------------------ No results found for: "BNP"  CBG: No results for input(s): "GLUCAP" in the last 168 hours.  Recent Results (from the past 240 hour(s))  Surgical PCR screen     Status: Abnormal   Collection Time: 05/19/22  3:51 AM   Specimen: Nasal Mucosa; Nasal Swab  Result Value Ref Range Status    MRSA, PCR POSITIVE (A) NEGATIVE Final    Comment: RESULT CALLED TO, READ BACK BY AND VERIFIED WITH: COLLIER RN 05/19/22 @ 0548 BY AB    Staphylococcus aureus POSITIVE (A) NEGATIVE Final    Comment: (NOTE) The Xpert SA Assay (FDA approved for NASAL specimens in patients 70 years of age and older), is one component of a comprehensive surveillance program. It is not intended to diagnose infection nor to guide or monitor treatment. Performed at [facility], [place]  [address], [place], [place] [address]   Aerobic/Anaerobic Culture w Gram Stain (surgical/deep wound)     Status: None (Preliminary result)   Collection Time: 05/19/22  2:18 PM   Specimen: PATH Other; Body Fluid  Result Value Ref Range Status   Specimen Description TISSUE THUMB RIGHT  Final   Special Requests TIS FOUND IN SWABS  Final   Gram Stain   Final    RARE WBC PRESENT,BOTH PMN AND MONONUCLEAR RARE GRAM POSITIVE COCCI IN PAIRS RARE GRAM POSITIVE COCCI IN CLUSTERS Performed at [facility], [address] [place], [place] [address]    Culture ABUNDANT STAPHYLOCOCCUS AUREUS  Final   Report Status PENDING  Incomplete  Aerobic/Anaerobic Culture w Gram Stain (surgical/deep wound)     Status: None (Preliminary result)   Collection Time: 05/19/22  2:21 PM   Specimen: PATH Other; Body Fluid  Result Value Ref Range Status   Specimen Description THUMB RIGHT  Final   Special Requests SWAB OF RT THUMB BONE  Final   Gram Stain   Final    RARE WBC PRESENT,BOTH PMN AND MONONUCLEAR RARE GRAM POSITIVE COCCI IN PAIRS Performed at [facility], [place] [address], [place], [place] [address]    Culture FEW STAPHYLOCOCCUS AUREUS  Final   Report Status PENDING  Incomplete  Culture, blood (Routine X 2) w Reflex to ID Panel     Status: None (Preliminary result)   Collection Time: 05/19/22  5:52 PM   Specimen: BLOOD  Result Value Ref Range Status   Specimen Description BLOOD LEFT ANTECUBITAL  Final   Special Requests    Final    BOTTLES DRAWN AEROBIC AND ANAEROBIC Blood Culture results may not be optimal due to an inadequate volume of blood received in culture bottles   Culture   Final    NO GROWTH < 12 HOURS Performed at [facility], [place] [address] [place], [place] [address]    Report Status PENDING  Incomplete  Culture, blood (Routine X 2) w Reflex to ID Panel     Status: None (Preliminary result)   Collection Time: 05/19/22  6:03 PM   Specimen: BLOOD LEFT FOREARM  Result Value Ref Range Status   Specimen Description BLOOD LEFT FOREARM  Final   Special Requests   Final    BOTTLES DRAWN AEROBIC AND ANAEROBIC Blood Culture results may not be optimal due to an inadequate volume of blood received in culture bottles   Culture   Final    NO GROWTH < 12 HOURS Performed at [facility], [place] [address] [place], [place] [address]    Report Status PENDING  Incomplete     Radiology Studies: No results found.   Marzetta Board, MD, PhD [facility]  Between 7 am - 7 pm I am available, please contact me via Amion (for emergencies) or Securechat (non urgent messages)  Between 7 pm - 7 am I am not available, please contact night coverage MD/APP via Amion

## 2022-05-20 NOTE — Progress Notes (Signed)
Subjective: No new complaints   Antibiotics:  Anti-infectives (From admission, onward)    Start     Dose/Rate Route Frequency Ordered Stop   05/19/22 1815  DAPTOmycin (CUBICIN) 500 mg in sodium chloride 0.9 % IVPB        8 mg/kg  63.5 kg 120 mL/hr over 30 Minutes Intravenous Daily 05/19/22 1729     05/18/22 0545  vancomycin (VANCOCIN) IVPB 1000 mg/200 mL premix        1,000 mg 200 mL/hr over 60 Minutes Intravenous  Once 05/18/22 0537 05/18/22 0650       Medications: Scheduled Meds:  enoxaparin (LOVENOX) injection  40 mg Subcutaneous Q24H   hepatitis A virus (PF) vaccine  1 mL Intramuscular Once   methadone  100 mg Oral Daily   mupirocin ointment  1 Application Nasal BID   nicotine  21 mg Transdermal Daily   Continuous Infusions:  DAPTOmycin (CUBICIN) 500 mg in sodium chloride 0.9 % IVPB Stopped (05/19/22 2009)   lactated ringers 75 mL/hr at 05/20/22 1526   PRN Meds:.acetaminophen **OR** acetaminophen, bisacodyl, hydrALAZINE, morphine injection, ondansetron **OR** ondansetron (ZOFRAN) IV, oxyCODONE, polyethylene glycol    Objective: Weight change:   Intake/Output Summary (Last 24 hours) at 05/20/2022 1728 Last data filed at 05/20/2022 1526 Gross per 24 hour  Intake 1786.29 ml  Output --  Net 1786.29 ml   Blood pressure 125/69, pulse 70, temperature 98.4 F (36.9 C), temperature source Oral, resp. rate 16, height 5\' 11"  (1.803 m), weight 63.5 kg, SpO2 98 %. Temp:  [98.3 F (36.8 C)-98.7 F (37.1 C)] 98.4 F (36.9 C) (01/31 1536) Pulse Rate:  [68-92] 70 (01/31 1536) Resp:  [16-17] 16 (01/31 1536) BP: (113-152)/(69-91) 125/69 (01/31 1536) SpO2:  [95 %-99 %] 98 % (01/31 1536)  Physical Exam: Physical Exam Constitutional:      Appearance: He is well-developed.  HENT:     Head: Normocephalic and atraumatic.  Eyes:     Conjunctiva/sclera: Conjunctivae normal.  Cardiovascular:     Rate and Rhythm: Normal rate and regular rhythm.  Pulmonary:      Effort: Pulmonary effort is normal. No respiratory distress.     Breath sounds: Normal breath sounds. No stridor. No wheezing.  Abdominal:     General: There is no distension.     Palpations: Abdomen is soft.  Musculoskeletal:     Cervical back: Normal range of motion and neck supple.  Skin:    General: Skin is warm and dry.     Findings: No erythema or rash.  Neurological:     General: No focal deficit present.     Mental Status: He is alert and oriented to person, place, and time.  Psychiatric:        Mood and Affect: Mood normal.        Behavior: Behavior normal.        Thought Content: Thought content normal.        Judgment: Judgment normal.     Right thumb is bandaged  CBC:    BMET Recent Labs    05/19/22 0535 05/20/22 0312  NA 138 135  K 4.2 4.0  CL 104 99  CO2 28 26  GLUCOSE 104* 202*  BUN 13 20  CREATININE 0.85 1.30*  CALCIUM 9.1 8.6*     Liver Panel  Recent Labs    05/20/22 0312  PROT 6.0*  ALBUMIN 3.1*  AST 28  ALT 45*  ALKPHOS 74  BILITOT 0.5  Sedimentation Rate Recent Labs    05/19/22 1750  ESRSEDRATE 17*   C-Reactive Protein Recent Labs    05/19/22 1750  CRP 0.5    Micro Results: Recent Results (from the past 720 hour(s))  Surgical PCR screen     Status: Abnormal   Collection Time: 05/19/22  3:51 AM   Specimen: Nasal Mucosa; Nasal Swab  Result Value Ref Range Status   MRSA, PCR POSITIVE (A) NEGATIVE Final    Comment: RESULT CALLED TO, READ BACK BY AND VERIFIED WITH: COLLIER RN 05/19/22 @ 0548 BY AB    Staphylococcus aureus POSITIVE (A) NEGATIVE Final    Comment: (NOTE) The Xpert SA Assay (FDA approved for NASAL specimens in patients 77 years of age and older), is one component of a comprehensive surveillance program. It is not intended to diagnose infection nor to guide or monitor treatment. Performed at [facility], [place] [address] [place], [place] [address]   Aerobic/Anaerobic Culture w Gram Stain  (surgical/deep wound)     Status: None (Preliminary result)   Collection Time: 05/19/22  2:18 PM   Specimen: PATH Other; Body Fluid  Result Value Ref Range Status   Specimen Description TISSUE THUMB RIGHT  Final   Special Requests TIS FOUND IN SWABS  Final   Gram Stain   Final    RARE WBC PRESENT,BOTH PMN AND MONONUCLEAR RARE GRAM POSITIVE COCCI IN PAIRS RARE GRAM POSITIVE COCCI IN CLUSTERS Performed at [facility], [address], [place], [place] [address]    Culture ABUNDANT STAPHYLOCOCCUS AUREUS  Final   Report Status PENDING  Incomplete  Aerobic/Anaerobic Culture w Gram Stain (surgical/deep wound)     Status: None (Preliminary result)   Collection Time: 05/19/22  2:21 PM   Specimen: PATH Other; Body Fluid  Result Value Ref Range Status   Specimen Description THUMB RIGHT  Final   Special Requests SWAB OF RT THUMB BONE  Final   Gram Stain   Final    RARE WBC PRESENT,BOTH PMN AND MONONUCLEAR RARE GRAM POSITIVE COCCI IN PAIRS Performed at [facility], [place] [address] [place], [place] [address]    Culture FEW STAPHYLOCOCCUS AUREUS  Final   Report Status PENDING  Incomplete  Culture, blood (Routine X 2) w Reflex to ID Panel     Status: None (Preliminary result)   Collection Time: 05/19/22  5:52 PM   Specimen: BLOOD  Result Value Ref Range Status   Specimen Description BLOOD LEFT ANTECUBITAL  Final   Special Requests   Final    BOTTLES DRAWN AEROBIC AND ANAEROBIC Blood Culture results may not be optimal due to an inadequate volume of blood received in culture bottles   Culture   Final    NO GROWTH < 12 HOURS Performed at [facility], [place] [address], [place], [place] [address]    Report Status PENDING  Incomplete  Culture, blood (Routine X 2) w Reflex to ID Panel     Status: None (Preliminary result)   Collection Time: 05/19/22  6:03 PM   Specimen: BLOOD LEFT FOREARM  Result Value Ref Range Status   Specimen Description BLOOD LEFT FOREARM  Final   Special  Requests   Final    BOTTLES DRAWN AEROBIC AND ANAEROBIC Blood Culture results may not be optimal due to an inadequate volume of blood received in culture bottles   Culture   Final    NO GROWTH < 12 HOURS Performed at [facility],  [address] [place], [place] [address]    Report Status PENDING  Incomplete    Studies/Results: No results found.    Assessment/Plan:  INTERVAL HISTORY: Since undergone surgery by Dr. Greta Doom and intraoperative cultures have yielded Staphylococcus aureus with sensitivities pending   Principal Problem:   Osteomyelitis of right hand ([place]) Active Problems:   Long-term current use of methadone for opiate dependence ([place])   Tobacco dependence    Timothy Smith is a 37 y.o. male with prior problems with IV drug abuse now on chronic opiate maintenance admitted with osteomyelitis of the thumb status post I&D with cultures yielding Staphylococcus aureus.  His nasal PCR was positive for MRSA which makes this the most likely pathogen though it certainly could be MSSA as well.  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  In the interim we will continue daptomycin while we await susceptibility data  #2 Hepatitis C +: Order hepatitis C RNA and hepatitis C genotype.  Certainly be happy to treat him for hepatitis C as an outpatient he does not have antibodies to hepatitis A so I will vaccinate him with a dose #1 today I am awaiting his hepatitis B surface antibodies and if they are negative would recommend vaccination against hepatitis B as well.  I spent 52 minutes with the patient including than 50% of the time in face to face counseling of the patient regarding his osteomyelitis hepatitis C seropositivity along with review of medical records in preparation for the visit and during the visit  and in coordination of his care.    LOS: 2 days   Alcide Evener 05/20/2022, 5:28 PM

## 2022-05-20 NOTE — Progress Notes (Incomplete)
PHARMACY CONSULT NOTE FOR:  OUTPATIENT  PARENTERAL ANTIBIOTIC THERAPY (OPAT)  Indication:  Regimen:  End date:   IV antibiotic discharge orders are pended. To discharging provider:  please sign these orders via discharge navigator,  Select New Orders & click on the button choice - Manage This Unsigned Work.     Thank you for allowing pharmacy to be a part of this patient's care.  Yassmine Tamm A Caliope Ruppert 05/20/2022, 8:20 AM

## 2022-05-20 NOTE — Anesthesia Postprocedure Evaluation (Signed)
Anesthesia Post Note  Patient: Timothy Smith  Procedure(s) Performed: IRRIGATION AND DEBRIDEMENT RIGHT THUMB WITH CULTURE (Right: Thumb)     Patient location during evaluation: PACU Anesthesia Type: General Level of consciousness: awake and alert Pain management: pain level controlled Vital Signs Assessment: post-procedure vital signs reviewed and stable Respiratory status: spontaneous breathing, nonlabored ventilation, respiratory function stable and patient connected to nasal cannula oxygen Cardiovascular status: blood pressure returned to baseline and stable Postop Assessment: no apparent nausea or vomiting Anesthetic complications: no   No notable events documented.  Last Vitals:  Vitals:   05/20/22 0543 05/20/22 0737  BP: 126/77 113/71  Pulse: 68 74  Resp: 17 16  Temp: 36.9 C 36.9 C  SpO2: 95% 95%    Last Pain:  Vitals:   05/20/22 0737  TempSrc: Oral  PainSc:                  March Rummage Tylah Mancillas

## 2022-05-21 ENCOUNTER — Other Ambulatory Visit (HOSPITAL_COMMUNITY): Payer: Self-pay

## 2022-05-21 DIAGNOSIS — F17200 Nicotine dependence, unspecified, uncomplicated: Secondary | ICD-10-CM | POA: Diagnosis not present

## 2022-05-21 DIAGNOSIS — F1991 Other psychoactive substance use, unspecified, in remission: Secondary | ICD-10-CM | POA: Diagnosis not present

## 2022-05-21 DIAGNOSIS — M86141 Other acute osteomyelitis, right hand: Secondary | ICD-10-CM | POA: Diagnosis not present

## 2022-05-21 DIAGNOSIS — F17210 Nicotine dependence, cigarettes, uncomplicated: Secondary | ICD-10-CM | POA: Diagnosis not present

## 2022-05-21 DIAGNOSIS — Z22322 Carrier or suspected carrier of Methicillin resistant Staphylococcus aureus: Secondary | ICD-10-CM | POA: Diagnosis not present

## 2022-05-21 LAB — BASIC METABOLIC PANEL
Anion gap: 6 (ref 5–15)
BUN: 14 mg/dL (ref 6–20)
CO2: 27 mmol/L (ref 22–32)
Calcium: 8.6 mg/dL — ABNORMAL LOW (ref 8.9–10.3)
Chloride: 106 mmol/L (ref 98–111)
Creatinine, Ser: 0.95 mg/dL (ref 0.61–1.24)
GFR, Estimated: >60 mL/min (ref >60)
Glucose, Bld: 138 mg/dL — ABNORMAL HIGH (ref 70–99)
Potassium: 4.1 mmol/L (ref 3.5–5.1)
Sodium: 139 mmol/L (ref 135–145)

## 2022-05-21 MED ORDER — DOXYCYCLINE HYCLATE 100 MG PO TABS
100.0000 mg | ORAL_TABLET | Freq: Two times a day (BID) | ORAL | 0 refills | Status: AC
  Filled 2022-05-21: qty 84, 42d supply, fill #0

## 2022-05-21 MED ORDER — DOXYCYCLINE HYCLATE 100 MG PO TABS
100.0000 mg | ORAL_TABLET | Freq: Two times a day (BID) | ORAL | Status: DC
  Administered 2022-05-21: 100 mg via ORAL
  Filled 2022-05-21: qty 1

## 2022-05-21 NOTE — Plan of Care (Signed)
  Problem: Education: Goal: Knowledge of General Education information will improve Description: Including pain rating scale, medication(s)/side effects and non-pharmacologic comfort measures Outcome: Progressing   Problem: Health Behavior/Discharge Planning: Goal: Ability to manage health-related needs will improve Outcome: Progressing   Problem: Clinical Measurements: Goal: Ability to maintain clinical measurements within normal limits will improve Outcome: Progressing Goal: Cardiovascular complication will be avoided Outcome: Progressing

## 2022-05-21 NOTE — Progress Notes (Signed)
Per order, discharged patient.  Patient has abx from pharmacy, I gave 1st dose this am.  Pt to go to methadone clinic in am.  He will follow up with Ortho sx.  He verbalized understanding.  Cab voucher to patient and NT took patient down to cab with voucher.  NADN. Sreece, RN

## 2022-05-21 NOTE — Care Plan (Signed)
Security to room due to patient smoking in room.  After searching room they found a torch lighter, tagged and taken with them, no cigarettes, and an empty bottle of methadone.  Will continue to monitor. Edythe Clarity, RN

## 2022-05-21 NOTE — Discharge Summary (Signed)
Physician Discharge Summary  Timothy Smith MEQ:[id number] DOB: Apr 30, 1985 DOA: 05/18/2022  PCP: Penni Bombard, PA  Admit date: 05/18/2022 Discharge date: 05/21/2022  Admitted From: home Disposition:  home  Recommendations for Outpatient Follow-up:  Follow up with Dr Greta Doom in a week  Home Health: none Equipment/Devices: none  Discharge Condition: stable CODE STATUS: Full code Diet Orders (From admission, onward)     Start     Ordered   05/19/22 1755  Diet regular Room service appropriate? Yes; Fluid consistency: Thin  Diet effective now       Question Answer Comment  Room service appropriate? Yes   Fluid consistency: Thin      05/19/22 1754            HPI: Per admitting MD, Timothy Smith is a 37 y.o. male with medical history significant of opioid dependence on methadone presenting with a thumb infection.  He reports a thumb infection with severe pain.  He noticed an issue about a week or so ago.  He was working on a car but didn't notice an injury.  Finger feels warm but no fever.   He feels like the Vanc is making him feel "fuzzy, prickly all over."  No prior h/o similar.   Hospital Course / Discharge diagnoses: Principal Problem:   Osteomyelitis of right hand ([place]) Active Problems:   Long-term current use of methadone for opiate dependence ([place])   Tobacco dependence   MRSA colonization   Hepatitis C antibody positive in blood   H/O intravenous drug use in remission   Principal problem Right thumb osteomyelitis -patient was admitted to the hospital with right hand osteomyelitis.  Hand surgery was consulted, patient was taken to the OR on 1/13 he is status post I&D.  Infectious disease was also consulted.  Intraoperative cultures grew up MRSA.  Discussed with ID, patient will be transition to oral doxycycline for 6 weeks.  He is improved, has remained stable, afebrile and will be discharged home in stable condition.  He will have outpatient follow-up with hand  surgery as well as ID  Active problems Acute kidney injury -postoperatively creatinine went up to 1.3, normalized prior to discharge. History of opioid use disorder-currently stable on methadone, continue Tobacco use-recommend cessation after discharge  Sepsis ruled out   Discharge Instructions   Allergies as of 05/21/2022   No Known Allergies      Medication List     TAKE these medications    doxycycline 100 MG capsule Commonly known as: VIBRAMYCIN Take 1 capsule (100 mg total) by mouth 2 (two) times daily.   methadone 10 MG/ML solution Commonly known as: DOLOPHINE Take 100 mg by mouth daily. Mix with water (1 oz) prior to taking.       Consultations: ID Hand surgery   Procedures/Studies:  DG Finger Thumb Right  Result Date: 05/18/2022 CLINICAL DATA:  36 year old male with thumb injury 2 weeks ago. Redness and swelling now. EXAM: RIGHT THUMB 2+V COMPARISON:  None Available. FINDINGS: Soft tissue swelling at the right thumb. No soft tissue gas. No radiopaque foreign body identified. But there is aggressive osteolysis involving up to 5 mm of the tuft of the thumb distal phalanx consistent with osteomyelitis. The thumb IP joint is spared. No superimposed fracture or dislocation. IMPRESSION: Positive for OSTEOMYELITIS of the tuft of the thumb distal phalanx. Regional soft tissue swelling. No radiopaque foreign body. Electronically Signed   By: Genevie Ann M.D.   On: 05/18/2022 04:16  Subjective: - no chest pain, shortness of breath, no abdominal pain, nausea or vomiting.   Discharge Exam: BP 120/82 (BP Location: Left Arm)   Pulse 63   Temp 98.1 F (36.7 C) (Oral)   Resp 17   Ht 5\' 11"  (1.803 m)   Wt 63.5 kg   SpO2 96%   BMI 19.53 kg/m   General: Pt is alert, awake, not in acute distress Cardiovascular: RRR, S1/S2 +, no rubs, no gallops Respiratory: CTA bilaterally, no wheezing, no rhonchi Abdominal: Soft, NT, ND, bowel sounds + Extremities: no edema, no  cyanosis    The results of significant diagnostics from this hospitalization (including imaging, microbiology, ancillary and laboratory) are listed below for reference.     Microbiology: Recent Results (from the past 240 hour(s))  Surgical PCR screen     Status: Abnormal   Collection Time: 05/19/22  3:51 AM   Specimen: Nasal Mucosa; Nasal Swab  Result Value Ref Range Status   MRSA, PCR POSITIVE (A) NEGATIVE Final    Comment: RESULT CALLED TO, READ BACK BY AND VERIFIED WITH: COLLIER RN 05/19/22 @ 0548 BY AB    Staphylococcus aureus POSITIVE (A) NEGATIVE Final    Comment: (NOTE) The Xpert SA Assay (FDA approved for NASAL specimens in patients 13 years of age and older), is one component of a comprehensive surveillance program. It is not intended to diagnose infection nor to guide or monitor treatment. Performed at [facility], [place] [address], [place], [place] [address]   Aerobic/Anaerobic Culture w Gram Stain (surgical/deep wound)     Status: None (Preliminary result)   Collection Time: 05/19/22  2:18 PM   Specimen: PATH Other; Body Fluid  Result Value Ref Range Status   Specimen Description TISSUE THUMB RIGHT  Final   Special Requests TIS FOUND IN SWABS  Final   Gram Stain   Final    RARE WBC PRESENT,BOTH PMN AND MONONUCLEAR RARE GRAM POSITIVE COCCI IN PAIRS RARE GRAM POSITIVE COCCI IN CLUSTERS Performed at [facility], [address], [place], [place] [address]    Culture   Final    ABUNDANT METHICILLIN RESISTANT STAPHYLOCOCCUS AUREUS   Report Status PENDING  Incomplete   Organism ID, Bacteria METHICILLIN RESISTANT STAPHYLOCOCCUS AUREUS  Final      Susceptibility   Methicillin resistant staphylococcus aureus - MIC*    CIPROFLOXACIN >=8 RESISTANT Resistant     ERYTHROMYCIN >=8 RESISTANT Resistant     GENTAMICIN <=0.5 SENSITIVE Sensitive     OXACILLIN >=4 RESISTANT Resistant     TETRACYCLINE <=1 SENSITIVE Sensitive     VANCOMYCIN 1 SENSITIVE Sensitive      TRIMETH/SULFA 160 RESISTANT Resistant     CLINDAMYCIN <=0.25 SENSITIVE Sensitive     RIFAMPIN <=0.5 SENSITIVE Sensitive     Inducible Clindamycin NEGATIVE Sensitive     * ABUNDANT METHICILLIN RESISTANT STAPHYLOCOCCUS AUREUS  Aerobic/Anaerobic Culture w Gram Stain (surgical/deep wound)     Status: None (Preliminary result)   Collection Time: 05/19/22  2:21 PM   Specimen: PATH Other; Body Fluid  Result Value Ref Range Status   Specimen Description THUMB RIGHT  Final   Special Requests SWAB OF RT THUMB BONE  Final   Gram Stain   Final    RARE WBC PRESENT,BOTH PMN AND MONONUCLEAR RARE GRAM POSITIVE COCCI IN PAIRS Performed at [facility], [place] [address] [place], [place] [address]    Culture FEW METHICILLIN RESISTANT STAPHYLOCOCCUS AUREUS  Final   Report Status PENDING  Incomplete   Organism ID, Bacteria METHICILLIN RESISTANT STAPHYLOCOCCUS AUREUS  Final      Susceptibility   Methicillin resistant staphylococcus aureus - MIC*    CIPROFLOXACIN >=8 RESISTANT Resistant     ERYTHROMYCIN >=8 RESISTANT Resistant     GENTAMICIN <=0.5 SENSITIVE Sensitive     OXACILLIN >=4 RESISTANT Resistant     TETRACYCLINE <=1 SENSITIVE Sensitive     VANCOMYCIN 1 SENSITIVE Sensitive     TRIMETH/SULFA >=320 RESISTANT Resistant     CLINDAMYCIN <=0.25 SENSITIVE Sensitive     RIFAMPIN <=0.5 SENSITIVE Sensitive     Inducible Clindamycin NEGATIVE Sensitive     * FEW METHICILLIN RESISTANT STAPHYLOCOCCUS AUREUS  Culture, blood (Routine X 2) w Reflex to ID Panel     Status: None (Preliminary result)   Collection Time: 05/19/22  5:52 PM   Specimen: BLOOD  Result Value Ref Range Status   Specimen Description BLOOD LEFT ANTECUBITAL  Final   Special Requests   Final    BOTTLES DRAWN AEROBIC AND ANAEROBIC Blood Culture results may not be optimal due to an inadequate volume of blood received in culture bottles   Culture   Final    NO GROWTH 2 DAYS Performed at [facility], [address],  [place], [place] [address]    Report Status PENDING  Incomplete  Culture, blood (Routine X 2) w Reflex to ID Panel     Status: None (Preliminary result)   Collection Time: 05/19/22  6:03 PM   Specimen: BLOOD LEFT FOREARM  Result Value Ref Range Status   Specimen Description BLOOD LEFT FOREARM  Final   Special Requests   Final    BOTTLES DRAWN AEROBIC AND ANAEROBIC Blood Culture results may not be optimal due to an inadequate volume of blood received in culture bottles   Culture   Final    NO GROWTH 2 DAYS Performed at [facility], [place] [address] [place], [place] [address]    Report Status PENDING  Incomplete     Labs: Basic Metabolic Panel: Recent Labs  Lab 05/18/22 0501 05/19/22 0535 05/20/22 0312 05/21/22 0257  NA 134* 138 135 139  K 3.9 4.2 4.0 4.1  CL 101 104 99 106  CO2 26 28 26 27   GLUCOSE 125* 104* 202* 138*  BUN 21* 13 20 14   CREATININE 0.92 0.85 1.30* 0.95  CALCIUM 9.0 9.1 8.6* 8.6*  MG  --   --  2.0  --   PHOS  --   --  3.0  --    Liver Function Tests: Recent Labs  Lab 05/20/22 0312  AST 28  ALT 45*  ALKPHOS 74  BILITOT 0.5  PROT 6.0*  ALBUMIN 3.1*   CBC: Recent Labs  Lab 05/18/22 0501 05/19/22 0535 05/20/22 0312  WBC 8.3 7.2 8.6  NEUTROABS 4.3  --  6.8  HGB 15.7 15.1 13.5  HCT 45.2 44.2 39.0  MCV 86.4 88.9 87.4  PLT 261 248 247   CBG: No results for input(s): "GLUCAP" in the last 168 hours. Hgb A1c No results for input(s): "HGBA1C" in the last 72 hours. Lipid Profile No results for input(s): "CHOL", "HDL", "LDLCALC", "TRIG", "CHOLHDL", "LDLDIRECT" in the last 72 hours. Thyroid function studies No results for input(s): "TSH", "T4TOTAL", "T3FREE", "THYROIDAB" in the last 72 hours.  Invalid input(s): "FREET3" Urinalysis No results found for: "COLORURINE", "APPEARANCEUR", "LABSPEC", "PHURINE", "GLUCOSEU", "HGBUR", "BILIRUBINUR", "KETONESUR", "PROTEINUR", "UROBILINOGEN", "NITRITE", "LEUKOCYTESUR"  FURTHER DISCHARGE INSTRUCTIONS:   Get  Medicines reviewed and  adjusted: Please take all your medications with you for your next visit with your Primary MD   Laboratory/radiological data: Please request your Primary MD to go over all hospital tests and procedure/radiological results at the follow up, please ask your Primary MD to get all Hospital records sent to his/her office.   In some cases, they will be blood work, cultures and biopsy results pending at the time of your discharge. Please request that your primary care M.D. goes through all the records of your hospital data and follows up on these results.   Also Note the following: If you experience worsening of your admission symptoms, develop shortness of breath, life threatening emergency, suicidal or homicidal thoughts you must seek medical attention immediately by calling 911 or calling your MD immediately  if symptoms less severe.   You must read complete instructions/literature along with all the possible adverse reactions/side effects for all the Medicines you take and that have been prescribed to you. Take any new Medicines after you have completely understood and accpet all the possible adverse reactions/side effects.    Do not drive when taking Pain medications or sleeping medications (Benzodaizepines)   Do not take more than prescribed Pain, Sleep and Anxiety Medications. It is not advisable to combine anxiety,sleep and pain medications without talking with your primary care practitioner   Special Instructions: If you have smoked or chewed Tobacco  in the last 2 yrs please stop smoking, stop any regular Alcohol  and or any Recreational drug use.   Wear Seat belts while driving.   Please note: You were cared for by a hospitalist during your hospital stay. Once you are discharged, your primary care physician will handle any further medical issues. Please note that NO REFILLS for any discharge medications will be authorized once you are discharged, as it is imperative  that you return to your primary care physician (or establish a relationship with a primary care physician if you do not have one) for your post hospital discharge needs so that they can reassess your need for medications and monitor your lab values.  Time coordinating discharge: 35 minutes  SIGNED:  Pamella Pert, MD, PhD 05/21/2022, 9:54 AM

## 2022-05-21 NOTE — Progress Notes (Signed)
Subjective: No new complaints   Antibiotics:  Anti-infectives (From admission, onward)    Start     Dose/Rate Route Frequency Ordered Stop   05/21/22 1000  doxycycline (VIBRA-TABS) tablet 100 mg        100 mg Oral Every 12 hours 05/21/22 0912     05/21/22 0000  doxycycline (VIBRA-TABS) 100 MG tablet        100 mg Oral 2 times daily 05/21/22 0913     05/19/22 1815  DAPTOmycin (CUBICIN) 500 mg in sodium chloride 0.9 % IVPB  Status:  Discontinued        8 mg/kg  63.5 kg 120 mL/hr over 30 Minutes Intravenous Daily 05/19/22 1729 05/21/22 0912   05/18/22 0545  vancomycin (VANCOCIN) IVPB 1000 mg/200 mL premix        1,000 mg 200 mL/hr over 60 Minutes Intravenous  Once 05/18/22 0537 05/18/22 0650       Medications: Scheduled Meds:  doxycycline  100 mg Oral Q12H   enoxaparin (LOVENOX) injection  40 mg Subcutaneous Q24H   hepatitis A virus (PF) vaccine  1 mL Intramuscular Once   methadone  100 mg Oral Daily   mupirocin ointment  1 Application Nasal BID   nicotine  21 mg Transdermal Daily   Continuous Infusions:  lactated ringers Stopped (05/21/22 0827)   PRN Meds:.acetaminophen **OR** acetaminophen, bisacodyl, hydrALAZINE, morphine injection, ondansetron **OR** ondansetron (ZOFRAN) IV, oxyCODONE, polyethylene glycol    Objective: Weight change:   Intake/Output Summary (Last 24 hours) at 05/21/2022 1106 Last data filed at 05/20/2022 2300 Gross per 24 hour  Intake 1316.29 ml  Output 1000 ml  Net 316.29 ml    Blood pressure 120/82, pulse 63, temperature 98.1 F (36.7 C), temperature source Oral, resp. rate 17, height 5\' 11"  (1.803 m), weight 63.5 kg, SpO2 96 %. Temp:  [98.1 F (36.7 C)-98.4 F (36.9 C)] 98.1 F (36.7 C) (02/01 0341) Pulse Rate:  [63-70] 63 (02/01 0341) Resp:  [16-17] 17 (01/31 1934) BP: (120-125)/(69-82) 120/82 (02/01 0341) SpO2:  [96 %-98 %] 96 % (02/01 0341)  Physical Exam: Physical Exam Constitutional:      Appearance: He is  well-developed.  HENT:     Head: Normocephalic and atraumatic.  Eyes:     Conjunctiva/sclera: Conjunctivae normal.  Cardiovascular:     Rate and Rhythm: Normal rate and regular rhythm.  Pulmonary:     Effort: Pulmonary effort is normal. No respiratory distress.     Breath sounds: No wheezing.  Abdominal:     General: There is no distension.     Palpations: Abdomen is soft.  Musculoskeletal:        General: Normal range of motion.     Cervical back: Normal range of motion and neck supple.  Skin:    General: Skin is warm and dry.     Findings: No erythema or rash.  Neurological:     General: No focal deficit present.     Mental Status: He is alert and oriented to person, place, and time.  Psychiatric:        Mood and Affect: Mood normal.        Behavior: Behavior normal.        Thought Content: Thought content normal.        Judgment: Judgment normal.     Right thumb is bandaged  CBC:    BMET Recent Labs    05/20/22 0312 05/21/22 0257  NA 135 139  K  4.0 4.1  CL 99 106  CO2 26 27  GLUCOSE 202* 138*  BUN 20 14  CREATININE 1.30* 0.95  CALCIUM 8.6* 8.6*      Liver Panel  Recent Labs    05/20/22 0312  PROT 6.0*  ALBUMIN 3.1*  AST 28  ALT 45*  ALKPHOS 74  BILITOT 0.5        Sedimentation Rate Recent Labs    05/19/22 1750  ESRSEDRATE 17*    C-Reactive Protein Recent Labs    05/19/22 1750  CRP 0.5     Micro Results: Recent Results (from the past 720 hour(s))  Surgical PCR screen     Status: Abnormal   Collection Time: 05/19/22  3:51 AM   Specimen: Nasal Mucosa; Nasal Swab  Result Value Ref Range Status   MRSA, PCR POSITIVE (A) NEGATIVE Final    Comment: RESULT CALLED TO, READ BACK BY AND VERIFIED WITH: COLLIER RN 05/19/22 @ 0548 BY AB    Staphylococcus aureus POSITIVE (A) NEGATIVE Final    Comment: (NOTE) The Xpert SA Assay (FDA approved for NASAL specimens in patients 46 years of age and older), is one component of a  comprehensive surveillance program. It is not intended to diagnose infection nor to guide or monitor treatment. Performed at [facility], [place] [address], [place], [place] [address]   Aerobic/Anaerobic Culture w Gram Stain (surgical/deep wound)     Status: None (Preliminary result)   Collection Time: 05/19/22  2:18 PM   Specimen: PATH Other; Body Fluid  Result Value Ref Range Status   Specimen Description TISSUE THUMB RIGHT  Final   Special Requests TIS FOUND IN SWABS  Final   Gram Stain   Final    RARE WBC PRESENT,BOTH PMN AND MONONUCLEAR RARE GRAM POSITIVE COCCI IN PAIRS RARE GRAM POSITIVE COCCI IN CLUSTERS Performed at [facility], [address], [place], [place] [address]    Culture   Final    ABUNDANT METHICILLIN RESISTANT STAPHYLOCOCCUS AUREUS   Report Status PENDING  Incomplete   Organism ID, Bacteria METHICILLIN RESISTANT STAPHYLOCOCCUS AUREUS  Final      Susceptibility   Methicillin resistant staphylococcus aureus - MIC*    CIPROFLOXACIN >=8 RESISTANT Resistant     ERYTHROMYCIN >=8 RESISTANT Resistant     GENTAMICIN <=0.5 SENSITIVE Sensitive     OXACILLIN >=4 RESISTANT Resistant     TETRACYCLINE <=1 SENSITIVE Sensitive     VANCOMYCIN 1 SENSITIVE Sensitive     TRIMETH/SULFA 160 RESISTANT Resistant     CLINDAMYCIN <=0.25 SENSITIVE Sensitive     RIFAMPIN <=0.5 SENSITIVE Sensitive     Inducible Clindamycin NEGATIVE Sensitive     * ABUNDANT METHICILLIN RESISTANT STAPHYLOCOCCUS AUREUS  Aerobic/Anaerobic Culture w Gram Stain (surgical/deep wound)     Status: None (Preliminary result)   Collection Time: 05/19/22  2:21 PM   Specimen: PATH Other; Body Fluid  Result Value Ref Range Status   Specimen Description THUMB RIGHT  Final   Special Requests SWAB OF RT THUMB BONE  Final   Gram Stain   Final    RARE WBC PRESENT,BOTH PMN AND MONONUCLEAR RARE GRAM POSITIVE COCCI IN PAIRS Performed at [facility], [place] [address] [place], [place] [address]     Culture FEW METHICILLIN RESISTANT STAPHYLOCOCCUS AUREUS  Final   Report Status PENDING  Incomplete   Organism ID, Bacteria METHICILLIN RESISTANT STAPHYLOCOCCUS AUREUS  Final      Susceptibility   Methicillin resistant staphylococcus aureus - MIC*  CIPROFLOXACIN >=8 RESISTANT Resistant     ERYTHROMYCIN >=8 RESISTANT Resistant     GENTAMICIN <=0.5 SENSITIVE Sensitive     OXACILLIN >=4 RESISTANT Resistant     TETRACYCLINE <=1 SENSITIVE Sensitive     VANCOMYCIN 1 SENSITIVE Sensitive     TRIMETH/SULFA >=320 RESISTANT Resistant     CLINDAMYCIN <=0.25 SENSITIVE Sensitive     RIFAMPIN <=0.5 SENSITIVE Sensitive     Inducible Clindamycin NEGATIVE Sensitive     * FEW METHICILLIN RESISTANT STAPHYLOCOCCUS AUREUS  Culture, blood (Routine X 2) w Reflex to ID Panel     Status: None (Preliminary result)   Collection Time: 05/19/22  5:52 PM   Specimen: BLOOD  Result Value Ref Range Status   Specimen Description BLOOD LEFT ANTECUBITAL  Final   Special Requests   Final    BOTTLES DRAWN AEROBIC AND ANAEROBIC Blood Culture results may not be optimal due to an inadequate volume of blood received in culture bottles   Culture   Final    NO GROWTH 2 DAYS Performed at [facility] [address] [place], [place] [address]    Report Status PENDING  Incomplete  Culture, blood (Routine X 2) w Reflex to ID Panel     Status: None (Preliminary result)   Collection Time: 05/19/22  6:03 PM   Specimen: BLOOD LEFT FOREARM  Result Value Ref Range Status   Specimen Description BLOOD LEFT FOREARM  Final   Special Requests   Final    BOTTLES DRAWN AEROBIC AND ANAEROBIC Blood Culture results may not be optimal due to an inadequate volume of blood received in culture bottles   Culture   Final    NO GROWTH 2 DAYS Performed at [facility], [place] [address], [place], [place] [address]    Report Status PENDING  Incomplete    Studies/Results: No results found.    Assessment/Plan:  INTERVAL HISTORY:  CX + MRSA   Principal Problem:   Osteomyelitis of right hand ([facility]) Active Problems:   Long-term current use of methadone for opiate dependence ([facility])   Tobacco dependence   MRSA colonization   Hepatitis C antibody positive in blood   H/O intravenous drug use in remission    Timothy Smith is a 37 y.o. male with prior problems with IV drug abuse now on chronic opiate maintenance admitted with osteomyelitis of the thumb status post I&D with cultures yielding Staphylococcus aureus.  His nasal PCR was positive for MRSA which makes this the most likely pathogen though it certainly could be MSSA as well.  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  2.  Chronic hepatitis C without hepatic coma still needs genotype and fibro sure as well as elastography.  He is scheduled follow-up for clinic.   Timothy Smith has an appointment on 06/15/2022 at [place] with Dr. West Bali  The [facility], which  is located in the [facility] at  [place] in [place].  [address], which is located to the left of the elevators.  Phone: [phone]  Fax: [phone]  [email or web address]  The patient should arrive 30 minutes prior to their appoitment.   I spent 53 minutes with the patient including than 50% of the time in face to face counseling of the patient re her MRSA osteomyelitis, chronic hepatitis C  along with  review of medical records in preparation for the visit and during the visit and in coordination of his care.    LOS: 3 days   Alcide Evener 05/21/2022, 11:06 AM

## 2022-05-21 NOTE — Care Management Important Message (Signed)
Important Message  Patient Details  Name: Timothy Smith MRN: [id number] Date of Birth: 04-19-1986   Medicare Important Message Given:  Yes     Hannah Beat 05/21/2022, 4:02 PM

## 2022-05-22 LAB — HCV RNA QUANT RFLX ULTRA OR GENOTYP
HCV RNA Qnt(log copy/mL): 6.598 log10 IU/mL
HepC Qn: 3960000 IU/mL

## 2022-05-22 LAB — HEPATITIS C GENOTYPE

## 2022-05-24 LAB — CULTURE, BLOOD (ROUTINE X 2)
Culture: NO GROWTH
Culture: NO GROWTH

## 2022-05-24 LAB — AEROBIC/ANAEROBIC CULTURE W GRAM STAIN (SURGICAL/DEEP WOUND)

## 2022-06-02 LAB — HEPATITIS B SURFACE ANTIBODY, QUANTITATIVE: Hep B S AB Quant (Post): <3.1 m[IU]/mL — ABNORMAL LOW (ref >9.9)

## 2022-06-02 NOTE — Addendum Note (Signed)
Addendum  created 06/02/22 1037 by Shelene Krage, March Rummage, DO   Intraprocedure Staff edited

## 2022-06-15 ENCOUNTER — Ambulatory Visit: Payer: Medicare Other | Admitting: Infectious Diseases

## 2022-08-25 ENCOUNTER — Other Ambulatory Visit: Payer: Self-pay

## 2022-08-25 ENCOUNTER — Emergency Department (HOSPITAL_COMMUNITY)
Admission: EM | Admit: 2022-08-25 | Discharge: 2022-08-25 | Discharge status: Against Medical Advice | Payer: Medicare Other | Attending: Student in an Organized Health Care Education/Training Program | Admitting: Student in an Organized Health Care Education/Training Program

## 2022-08-25 ENCOUNTER — Encounter (HOSPITAL_COMMUNITY): Payer: Self-pay | Admitting: Emergency Medicine

## 2022-08-25 DIAGNOSIS — M542 Cervicalgia: Secondary | ICD-10-CM | POA: Insufficient documentation

## 2022-08-25 DIAGNOSIS — F17200 Nicotine dependence, unspecified, uncomplicated: Secondary | ICD-10-CM | POA: Insufficient documentation

## 2022-08-25 DIAGNOSIS — R21 Rash and other nonspecific skin eruption: Secondary | ICD-10-CM | POA: Insufficient documentation

## 2022-08-25 DIAGNOSIS — R000 Tachycardia, unspecified: Secondary | ICD-10-CM | POA: Insufficient documentation

## 2022-08-25 DIAGNOSIS — Z5329 Procedure and treatment not carried out because of patient's decision for other reasons: Secondary | ICD-10-CM | POA: Diagnosis not present

## 2022-08-25 DIAGNOSIS — I10 Essential (primary) hypertension: Secondary | ICD-10-CM | POA: Diagnosis not present

## 2022-08-25 NOTE — ED Notes (Signed)
Pt took off his gown and stated "actually I don't need to wait here any longer and go through this, I am just going to leave". RN tried to get patient to stay and wait for the MD but pt declined.

## 2022-08-25 NOTE — ED Notes (Signed)
Pt repeatedly hitting the call light and asking to speak to the MD as well as asking for a different creams to place on his rash. This RN has notified the patient multiple times that we need to wait to speak to the MD and that yes his rash will be addressed, we just need to wait to hear the MD recommendations. Pt agreeable at the time of RN leaving the room but will then hit the call light 5 minutes later asking the same thing again. MD notified.

## 2022-08-25 NOTE — ED Provider Notes (Signed)
37 year old male presenting to the emergency department for a rash.  Patient spoke with the resident physician but eloped prior to being evaluated by the attending physician.  We were unable to have AMA discussion with the patient.  Patient decided he wanted to leave and no longer wait.   Bryony Kaman, DO 08/25/22 1048
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
## 2022-08-25 NOTE — ED Triage Notes (Signed)
Pt reports rash on his arms, chest, torso, and legs. Denies exposures to anything new. Pt reports subjective fever at home.

## 2023-10-21 DIAGNOSIS — R7303 Prediabetes: Secondary | ICD-10-CM | POA: Diagnosis not present

## 2023-10-21 DIAGNOSIS — E785 Hyperlipidemia, unspecified: Secondary | ICD-10-CM | POA: Diagnosis not present

## 2023-10-21 DIAGNOSIS — B182 Chronic viral hepatitis C: Secondary | ICD-10-CM | POA: Diagnosis not present

## 2023-10-21 DIAGNOSIS — Z008 Encounter for other general examination: Secondary | ICD-10-CM | POA: Diagnosis not present

## 2023-10-21 DIAGNOSIS — F1911 Other psychoactive substance abuse, in remission: Secondary | ICD-10-CM | POA: Diagnosis not present

## 2023-10-21 DIAGNOSIS — Z79891 Long term (current) use of opiate analgesic: Secondary | ICD-10-CM | POA: Diagnosis not present

## 2023-10-21 DIAGNOSIS — F17210 Nicotine dependence, cigarettes, uncomplicated: Secondary | ICD-10-CM | POA: Diagnosis not present

## 2023-10-21 DIAGNOSIS — R002 Palpitations: Secondary | ICD-10-CM | POA: Diagnosis not present
